# Patient Record
Sex: Female | Born: 1935 | Race: White | Hispanic: No | Smoking: Former smoker
Health system: Southern US, Community
[De-identification: ages and names within clinical notes are randomized; demographics above are authoritative.]

## PROBLEM LIST (undated history)

## (undated) DIAGNOSIS — J449 Chronic obstructive pulmonary disease, unspecified: Secondary | ICD-10-CM

## (undated) DIAGNOSIS — I10 Essential (primary) hypertension: Secondary | ICD-10-CM

## (undated) HISTORY — PX: ABDOMINAL HYSTERECTOMY: SHX81

## (undated) HISTORY — PX: CHOLECYSTECTOMY: SHX55

## (undated) HISTORY — PX: TONSILLECTOMY AND ADENOIDECTOMY: SUR1326

---

## 2004-05-11 ENCOUNTER — Inpatient Hospital Stay: Payer: Self-pay | Admitting: Family Medicine

## 2004-05-11 ENCOUNTER — Other Ambulatory Visit: Payer: Self-pay

## 2004-05-16 ENCOUNTER — Other Ambulatory Visit: Payer: Self-pay

## 2005-08-24 ENCOUNTER — Ambulatory Visit: Payer: Self-pay | Admitting: Internal Medicine

## 2006-03-14 ENCOUNTER — Ambulatory Visit: Payer: Self-pay | Admitting: Gastroenterology

## 2006-08-29 ENCOUNTER — Ambulatory Visit: Payer: Self-pay | Admitting: Internal Medicine

## 2007-01-04 ENCOUNTER — Ambulatory Visit: Payer: Self-pay | Admitting: Rheumatology

## 2007-02-18 ENCOUNTER — Emergency Department: Payer: Self-pay | Admitting: Emergency Medicine

## 2007-02-18 ENCOUNTER — Other Ambulatory Visit: Payer: Self-pay

## 2007-02-19 ENCOUNTER — Emergency Department: Payer: Self-pay | Admitting: Emergency Medicine

## 2007-07-26 ENCOUNTER — Ambulatory Visit: Payer: Self-pay | Admitting: Orthopedic Surgery

## 2007-08-15 ENCOUNTER — Other Ambulatory Visit: Payer: Self-pay

## 2007-08-16 ENCOUNTER — Inpatient Hospital Stay: Payer: Self-pay | Admitting: Internal Medicine

## 2007-08-30 ENCOUNTER — Ambulatory Visit: Payer: Self-pay | Admitting: Internal Medicine

## 2007-09-28 ENCOUNTER — Ambulatory Visit: Payer: Self-pay | Admitting: Internal Medicine

## 2008-03-17 ENCOUNTER — Emergency Department: Payer: Self-pay | Admitting: Emergency Medicine

## 2008-04-10 ENCOUNTER — Ambulatory Visit: Payer: Self-pay | Admitting: Nephrology

## 2009-03-26 ENCOUNTER — Emergency Department: Payer: Self-pay | Admitting: Emergency Medicine

## 2009-05-30 ENCOUNTER — Ambulatory Visit: Payer: Self-pay | Admitting: Internal Medicine

## 2009-07-07 ENCOUNTER — Inpatient Hospital Stay (HOSPITAL_COMMUNITY): Admission: RE | Admit: 2009-07-07 | Discharge: 2009-07-08 | Payer: Self-pay | Admitting: Neurosurgery

## 2009-08-14 ENCOUNTER — Encounter: Admission: RE | Admit: 2009-08-14 | Discharge: 2009-08-14 | Payer: Self-pay | Admitting: Neurosurgery

## 2009-12-11 ENCOUNTER — Ambulatory Visit: Payer: Self-pay | Admitting: Ophthalmology

## 2009-12-24 ENCOUNTER — Ambulatory Visit: Payer: Self-pay | Admitting: Ophthalmology

## 2010-02-02 ENCOUNTER — Ambulatory Visit: Payer: Self-pay | Admitting: Gastroenterology

## 2010-02-17 ENCOUNTER — Ambulatory Visit: Payer: Self-pay | Admitting: Ophthalmology

## 2010-02-25 ENCOUNTER — Ambulatory Visit: Payer: Self-pay | Admitting: Ophthalmology

## 2010-06-13 ENCOUNTER — Emergency Department: Payer: Self-pay | Admitting: Unknown Physician Specialty

## 2010-08-06 LAB — DIFFERENTIAL
Basophils Relative: 1 % (ref 0–1)
Lymphs Abs: 2.9 10*3/uL (ref 0.7–4.0)
Monocytes Relative: 9 % (ref 3–12)
Neutro Abs: 3.6 10*3/uL (ref 1.7–7.7)

## 2010-08-06 LAB — CBC
MCHC: 35.3 g/dL (ref 30.0–36.0)
MCV: 98.3 fL (ref 78.0–100.0)
RDW: 13 % (ref 11.5–15.5)
WBC: 7.8 10*3/uL (ref 4.0–10.5)

## 2010-08-06 LAB — BASIC METABOLIC PANEL
BUN: 16 mg/dL (ref 6–23)
CO2: 29 mEq/L (ref 19–32)
Calcium: 9.9 mg/dL (ref 8.4–10.5)
Chloride: 100 mEq/L (ref 96–112)
Creatinine, Ser: 1.23 mg/dL — ABNORMAL HIGH (ref 0.4–1.2)
Potassium: 4.7 mEq/L (ref 3.5–5.1)
Sodium: 137 mEq/L (ref 135–145)

## 2011-10-16 ENCOUNTER — Ambulatory Visit: Payer: Self-pay | Admitting: Hematology & Oncology

## 2011-10-27 ENCOUNTER — Inpatient Hospital Stay: Payer: Self-pay | Admitting: Internal Medicine

## 2011-10-27 LAB — DIFFERENTIAL
Monocytes: 20 %
Segmented Neutrophils: 43 %

## 2011-10-27 LAB — HEPATIC FUNCTION PANEL A (ARMC)
Alkaline Phosphatase: 54 U/L (ref 50–136)
SGPT (ALT): 33 U/L
Total Protein: 7.1 g/dL (ref 6.4–8.2)

## 2011-10-27 LAB — URINALYSIS, COMPLETE
Hyaline Cast: 27
Nitrite: NEGATIVE
Protein: NEGATIVE

## 2011-10-27 LAB — CBC
HCT: 41.4 % (ref 35.0–47.0)
HGB: 14.1 g/dL (ref 12.0–16.0)
MCH: 33.1 pg (ref 26.0–34.0)
MCHC: 34.1 g/dL (ref 32.0–36.0)
Platelet: 60 10*3/uL — ABNORMAL LOW (ref 150–440)
WBC: 2 10*3/uL — CL (ref 3.6–11.0)

## 2011-10-27 LAB — TROPONIN I
Troponin-I: 0.05 ng/mL
Troponin-I: 0.05 ng/mL

## 2011-10-27 LAB — TSH: Thyroid Stimulating Horm: 1.97 u[IU]/mL

## 2011-10-27 LAB — BASIC METABOLIC PANEL
Calcium, Total: 8.9 mg/dL (ref 8.5–10.1)
Chloride: 91 mmol/L — ABNORMAL LOW (ref 98–107)
Co2: 25 mmol/L (ref 21–32)
Creatinine: 1.31 mg/dL — ABNORMAL HIGH (ref 0.60–1.30)
EGFR (African American): 44 — ABNORMAL LOW
EGFR (Non-African Amer.): 38 — ABNORMAL LOW
Osmolality: 255 (ref 275–301)
Potassium: 3.9 mmol/L (ref 3.5–5.1)
Sodium: 125 mmol/L — ABNORMAL LOW (ref 136–145)

## 2011-10-27 LAB — MAGNESIUM: Magnesium: 1.6 mg/dL — ABNORMAL LOW

## 2011-10-28 LAB — CBC WITH DIFFERENTIAL/PLATELET
Eosinophil #: 0 10*3/uL (ref 0.0–0.7)
Eosinophil %: 1.5 %
HCT: 35 % (ref 35.0–47.0)
Lymphocyte #: 0.9 10*3/uL — ABNORMAL LOW (ref 1.0–3.6)
Lymphocyte %: 45.7 %
Monocyte #: 0.4 x10 3/mm (ref 0.2–0.9)
Neutrophil #: 0.6 10*3/uL — ABNORMAL LOW (ref 1.4–6.5)
RDW: 12.9 % (ref 11.5–14.5)

## 2011-10-28 LAB — MAGNESIUM: Magnesium: 2.2 mg/dL

## 2011-10-28 LAB — BASIC METABOLIC PANEL
BUN: 14 mg/dL (ref 7–18)
Creatinine: 1.11 mg/dL (ref 0.60–1.30)
EGFR (Non-African Amer.): 46 — ABNORMAL LOW
Glucose: 91 mg/dL (ref 65–99)
Sodium: 133 mmol/L — ABNORMAL LOW (ref 136–145)

## 2011-10-29 LAB — BASIC METABOLIC PANEL
BUN: 8 mg/dL (ref 7–18)
Chloride: 103 mmol/L (ref 98–107)
Co2: 24 mmol/L (ref 21–32)
EGFR (Non-African Amer.): >60
Glucose: 99 mg/dL (ref 65–99)
Osmolality: 270 (ref 275–301)
Potassium: 4.3 mmol/L (ref 3.5–5.1)

## 2011-10-29 LAB — CBC WITH DIFFERENTIAL/PLATELET
Basophil #: 0.1 10*3/uL (ref 0.0–0.1)
Eosinophil #: 0.1 10*3/uL (ref 0.0–0.7)
Eosinophil %: 1.7 %
HCT: 37.2 % (ref 35.0–47.0)
HGB: 12.6 g/dL (ref 12.0–16.0)
Lymphocyte %: 43.3 %
Monocyte #: 0.4 x10 3/mm (ref 0.2–0.9)
Neutrophil %: 39.3 %
RDW: 12.8 % (ref 11.5–14.5)

## 2011-10-30 LAB — CBC WITH DIFFERENTIAL/PLATELET
Basophil %: 0.5 %
HCT: 36.9 % (ref 35.0–47.0)
HGB: 12.8 g/dL (ref 12.0–16.0)
Lymphocyte %: 46.9 %
MCH: 33.2 pg (ref 26.0–34.0)
MCV: 95 fL (ref 80–100)
Monocyte #: 0.5 x10 3/mm (ref 0.2–0.9)
Monocyte %: 16.1 %
Neutrophil #: 1.1 10*3/uL — ABNORMAL LOW (ref 1.4–6.5)
RBC: 3.87 10*6/uL (ref 3.80–5.20)

## 2011-10-30 LAB — BASIC METABOLIC PANEL
BUN: 5 mg/dL — ABNORMAL LOW (ref 7–18)
Co2: 27 mmol/L (ref 21–32)
Creatinine: 1.01 mg/dL (ref 0.60–1.30)
EGFR (African American): >60
Sodium: 136 mmol/L (ref 136–145)

## 2011-10-31 LAB — CBC WITH DIFFERENTIAL/PLATELET
Eosinophil %: 2.8 %
HCT: 37.7 % (ref 35.0–47.0)
HGB: 13.1 g/dL (ref 12.0–16.0)
Lymphocyte %: 37.5 %
MCH: 33.2 pg (ref 26.0–34.0)
MCV: 96 fL (ref 80–100)
Monocyte %: 15.1 %
Neutrophil #: 1.5 10*3/uL (ref 1.4–6.5)
Neutrophil %: 44 %
Platelet: 78 10*3/uL — ABNORMAL LOW (ref 150–440)
WBC: 3.5 10*3/uL — ABNORMAL LOW (ref 3.6–11.0)

## 2011-11-01 ENCOUNTER — Ambulatory Visit: Payer: Self-pay | Admitting: Oncology

## 2011-11-01 LAB — CULTURE, BLOOD (SINGLE)

## 2011-11-15 ENCOUNTER — Ambulatory Visit: Payer: Self-pay | Admitting: Hematology & Oncology

## 2011-12-16 ENCOUNTER — Encounter: Payer: Self-pay | Admitting: Internal Medicine

## 2012-11-11 ENCOUNTER — Emergency Department: Payer: Self-pay | Admitting: Emergency Medicine

## 2012-11-11 LAB — CBC
MCV: 96 fL (ref 80–100)
WBC: 8.2 10*3/uL (ref 3.6–11.0)

## 2012-11-11 LAB — URINALYSIS, COMPLETE
Bacteria: NONE SEEN
Bilirubin,UR: NEGATIVE
Ketone: NEGATIVE
Nitrite: NEGATIVE
RBC,UR: <1 /HPF (ref 0–5)
Specific Gravity: 1.004 (ref 1.003–1.030)
Squamous Epithelial: <1
WBC UR: 4 /HPF (ref 0–5)

## 2012-11-11 LAB — COMPREHENSIVE METABOLIC PANEL
Albumin: 4 g/dL (ref 3.4–5.0)
Anion Gap: 6 — ABNORMAL LOW (ref 7–16)
BUN: 13 mg/dL (ref 7–18)
Bilirubin,Total: 0.5 mg/dL (ref 0.2–1.0)
Co2: 26 mmol/L (ref 21–32)
EGFR (African American): >60
Glucose: 131 mg/dL — ABNORMAL HIGH (ref 65–99)
Osmolality: 283 (ref 275–301)
Potassium: 3.8 mmol/L (ref 3.5–5.1)
SGPT (ALT): 37 U/L (ref 12–78)
Total Protein: 7.5 g/dL (ref 6.4–8.2)

## 2012-11-11 LAB — CK TOTAL AND CKMB (NOT AT ARMC)
CK, Total: 150 U/L (ref 21–215)
CK-MB: 2.3 ng/mL (ref 0.5–3.6)

## 2012-11-11 LAB — TROPONIN I: Troponin-I: <0.02 ng/mL

## 2013-01-07 LAB — CBC
MCHC: 36 g/dL (ref 32.0–36.0)
MCV: 94 fL (ref 80–100)
RBC: 4.11 10*6/uL (ref 3.80–5.20)

## 2013-01-07 LAB — COMPREHENSIVE METABOLIC PANEL
Alkaline Phosphatase: 84 U/L (ref 50–136)
Calcium, Total: 9.1 mg/dL (ref 8.5–10.1)
Chloride: 94 mmol/L — ABNORMAL LOW (ref 98–107)
Co2: 25 mmol/L (ref 21–32)
Creatinine: 0.96 mg/dL (ref 0.60–1.30)
EGFR (African American): >60
Glucose: 119 mg/dL — ABNORMAL HIGH (ref 65–99)
Osmolality: 255 (ref 275–301)
SGPT (ALT): 32 U/L (ref 12–78)
Sodium: 127 mmol/L — ABNORMAL LOW (ref 136–145)
Total Protein: 7.2 g/dL (ref 6.4–8.2)

## 2013-01-07 LAB — URINALYSIS, COMPLETE
Blood: NEGATIVE
Ketone: NEGATIVE
Protein: NEGATIVE
Squamous Epithelial: 1
WBC UR: 3 /HPF (ref 0–5)

## 2013-01-07 LAB — TROPONIN I: Troponin-I: <0.02 ng/mL

## 2013-01-08 ENCOUNTER — Inpatient Hospital Stay: Payer: Self-pay | Admitting: Internal Medicine

## 2013-01-09 LAB — COMPREHENSIVE METABOLIC PANEL WITH GFR
Albumin: 3.1 g/dL — ABNORMAL LOW
Alkaline Phosphatase: 73 U/L
Anion Gap: 7
BUN: 10 mg/dL
Bilirubin,Total: 0.6 mg/dL
Calcium, Total: 8.9 mg/dL
Chloride: 105 mmol/L
Co2: 24 mmol/L
Creatinine: 1.05 mg/dL
EGFR (African American): 57 — ABNORMAL LOW
EGFR (Non-African Amer.): 49 — ABNORMAL LOW
Glucose: 99 mg/dL
Osmolality: 271
Potassium: 3.8 mmol/L
SGOT(AST): 25 U/L
SGPT (ALT): 24 U/L
Sodium: 136 mmol/L
Total Protein: 6.5 g/dL

## 2013-01-09 LAB — TSH: Thyroid Stimulating Horm: 3.45 u[IU]/mL

## 2013-01-09 LAB — CBC WITH DIFFERENTIAL/PLATELET
Basophil #: 0 x10 3/mm 3
Basophil %: 0.6 %
Eosinophil #: 0.3 x10 3/mm 3
Eosinophil %: 3.7 %
HCT: 38.4 %
HGB: 13.8 g/dL
Lymphocyte %: 36.5 %
Lymphs Abs: 2.8 x10 3/mm 3
MCH: 33.6 pg
MCHC: 35.9 g/dL
MCV: 94 fL
Monocyte #: 0.7 "x10 3/mm "
Monocyte %: 9.5 %
Neutrophil #: 3.8 x10 3/mm 3
Neutrophil %: 49.7 %
Platelet: 119 x10 3/mm 3 — ABNORMAL LOW
RBC: 4.1 X10 6/mm 3
RDW: 12.7 %
WBC: 7.6 x10 3/mm 3

## 2013-01-09 LAB — URINE CULTURE

## 2013-01-10 LAB — BASIC METABOLIC PANEL
Co2: 24 mmol/L (ref 21–32)
Creatinine: 1.05 mg/dL (ref 0.60–1.30)
EGFR (African American): 57 — ABNORMAL LOW
EGFR (Non-African Amer.): 49 — ABNORMAL LOW
Osmolality: 270 (ref 275–301)
Sodium: 135 mmol/L — ABNORMAL LOW (ref 136–145)

## 2013-01-10 LAB — CBC WITH DIFFERENTIAL/PLATELET
Basophil %: 0.7 %
Eosinophil #: 0.4 10*3/uL (ref 0.0–0.7)
MCHC: 35.9 g/dL (ref 32.0–36.0)
MCV: 93 fL (ref 80–100)
Neutrophil %: 38.9 %
RBC: 4.08 10*6/uL (ref 3.80–5.20)
RDW: 12.8 % (ref 11.5–14.5)

## 2013-02-07 ENCOUNTER — Encounter: Payer: Self-pay | Admitting: Internal Medicine

## 2014-01-04 ENCOUNTER — Observation Stay: Payer: Self-pay | Admitting: Internal Medicine

## 2014-01-04 LAB — URINALYSIS, COMPLETE
BACTERIA: NONE SEEN
BILIRUBIN, UR: NEGATIVE
BLOOD: NEGATIVE
GLUCOSE, UR: NEGATIVE mg/dL (ref 0–75)
Ketone: NEGATIVE
Leukocyte Esterase: NEGATIVE
NITRITE: NEGATIVE
PROTEIN: NEGATIVE
Ph: 7 (ref 4.5–8.0)
RBC,UR: <1 /HPF (ref 0–5)
SPECIFIC GRAVITY: 1.004 (ref 1.003–1.030)
WBC UR: <1 /HPF (ref 0–5)

## 2014-01-04 LAB — TROPONIN I: Troponin-I: <0.02 ng/mL

## 2014-01-04 LAB — CBC
HCT: 44.5 % (ref 35.0–47.0)
HGB: 14.6 g/dL (ref 12.0–16.0)
MCH: 32.8 pg (ref 26.0–34.0)
MCHC: 32.8 g/dL (ref 32.0–36.0)
MCV: 100 fL (ref 80–100)
Platelet: 116 10*3/uL — ABNORMAL LOW (ref 150–440)
RBC: 4.45 10*6/uL (ref 3.80–5.20)
RDW: 12.9 % (ref 11.5–14.5)
WBC: 7.4 10*3/uL (ref 3.6–11.0)

## 2014-01-04 LAB — COMPREHENSIVE METABOLIC PANEL
ALBUMIN: 3.9 g/dL (ref 3.4–5.0)
ALK PHOS: 69 U/L
ANION GAP: 9 (ref 7–16)
AST: 39 U/L — AB (ref 15–37)
BUN: 19 mg/dL — ABNORMAL HIGH (ref 7–18)
Bilirubin,Total: 0.6 mg/dL (ref 0.2–1.0)
CO2: 23 mmol/L (ref 21–32)
Calcium, Total: 9.2 mg/dL (ref 8.5–10.1)
Chloride: 109 mmol/L — ABNORMAL HIGH (ref 98–107)
Creatinine: 1.18 mg/dL (ref 0.60–1.30)
EGFR (African American): 49 — ABNORMAL LOW
GFR CALC NON AF AMER: 42 — AB
Glucose: 90 mg/dL (ref 65–99)
Osmolality: 283 (ref 275–301)
POTASSIUM: 4.4 mmol/L (ref 3.5–5.1)
SGPT (ALT): 26 U/L
Sodium: 141 mmol/L (ref 136–145)
TOTAL PROTEIN: 7.5 g/dL (ref 6.4–8.2)

## 2014-01-04 LAB — PROTIME-INR
INR: 1.1
Prothrombin Time: 14.2 secs (ref 11.5–14.7)

## 2014-01-04 LAB — CK TOTAL AND CKMB (NOT AT ARMC)
CK, Total: 139 U/L
CK-MB: 1.2 ng/mL (ref 0.5–3.6)

## 2014-07-20 ENCOUNTER — Ambulatory Visit: Payer: Self-pay | Admitting: Neurology

## 2014-07-20 ENCOUNTER — Observation Stay: Payer: Self-pay | Admitting: Internal Medicine

## 2014-08-12 ENCOUNTER — Encounter: Payer: Self-pay | Admitting: Internal Medicine

## 2014-09-06 NOTE — H&P (Signed)
PATIENT NAME:  Breanna Ford, Breanna Ford MR#:  [id number] DATE OF BIRTH:  1928/02/05  DATE OF ADMISSION:  01/08/2013  REFERRING PHYSICIAN: Dr. Zenda AlpersWebster   PRIMARY CARE PHYSICIAN:  Dr. Judithann SheenSparks  CHIEF COMPLAINT: Dizziness and weakness.   HISTORY OF PRESENT ILLNESS: This is an 81 year old Caucasian female with past medical history of coronary artery disease, chronic obstructive pulmonary disease and multiple other medical problems, who was just treated last week for  acute sinusitis with some antibiotic for five days. She is feeling very weak and dizzy. Denies any loss of consciousness. Feeling nauseous, but denies any chest pain or shortness of breath. Her sodium is at 127 in the ER, and CAT scan of the head is within normal limits. CAT scan also has revealed air-fluid levels in the left  maxillary sinus. She is also reporting that she has been having diarrhea today. She had five watery bowel movements with no blood in it. Denies any chest pain or shortness of breath. No fevers, no sick contacts. No recent travel. Denies any abdominal pain. Daughter is at bedside.   PAST MEDICAL HISTORY: Coronary artery disease, chronic obstructive pulmonary disease, mitral valve prolapse, thrombocytopenia, hypertension, hyperlipidemia.   PAST SURGICAL HISTORY: Bilateral cataract repair and cholecystectomy.   ALLERGIES:  SULFA.    PSYCHOSOCIAL HISTORY: Lives alone, quite independent and still drives and takes care of herself. Denies smoking, alcohol, or illicit drug usage.    FAMILY HISTORY: The patient does not know her parents  medical history.   HOME MEDICATIONS: Atrovent 2 puff inhalations q. 4 hours as needed, Prevacid 80 mg once daily, hydrochlorothiazide 20 mg once daily, losartan 100 mg once daily, omeprazole 20 mg once daily, lorazepam 0.5 mg as needed for anxiety, levothyroxine 100 mcg once daily, Flonase 50 mcg 1 inhalation once daily, clonidine 0.1 mg every six hours as needed for hypertension, Epogen 10 mg once  daily, amlodipine 5 mg once daily, Advair 100/50, 1 puff inhalation 2 times a day, albuterol as needed.   REVIEW OF SYSTEMS:  CONSTITUTIONAL: Denies any fever, fatigue. Complaining of weakness.  EYES: No blurry vision or glaucoma.  EARS, NOSE, THROAT: No epistaxis or discharge.  RESPIRATORY: Denies cough. Has chronic history of chronic obstructive pulmonary disease.  CARDIOVASCULAR: No chest pain, palpitations. Complaining of dizziness.  GASTROINTESTINAL: Has nausea. Denies vomiting, has diarrhea. Denies abdominal pain.  GENITOURINARY: No dysuria, hematuria.  GYNECOLOGIC AND BREASTS: Denies breast mass or vaginal discharge.  ENDOCRINE: No polyuria, nocturia. Has chronic hypothyroidism.  HEMATOLOGIC AND LYMPHATIC: No anemia, easy bruising.  INTEGUMENTARY: No acne, rash, lesions.  MUSCULOSKELETAL: No joint pain in the neck, back, shoulder, neck. Denies gout.  NEUROLOGIC:  No vertigo or ataxia.  PSYCHIATRIC: No ADD, OCD.   PHYSICAL EXAMINATION: VITAL SIGNS: Temperature 97.7, pulse 65, respirations 16, blood pressure 145/70, pulse oximetry 97%.  GENERAL APPEARANCE: Not under acute distress. Moderately built and nourished.  HEENT: Normocephalic, atraumatic. Pupils are equally reacting to light and accommodation. No scleral icterus. No conjunctival injection. Positive markedly sinus tenderness, more on the left side than on the right. Positive postnasal drip. Moist mucous membranes.  NECK: Supple. No JVD. No thyromegaly. Range of motion is intact.  LUNGS: Clear to auscultation bilaterally. No accessory muscle use and no anterior chest wall tenderness on palpation.  CARDIAC:  S1, S2  normal. Regular rate and rhythm. No murmurs.  GASTROINTESTINAL: Soft. Bowel sounds are positive in all four quadrants. Nontender, nondistended. No hepatosplenomegaly.  NEUROLOGIC: Awake, alert, oriented x 3. Motor and sensory grossly intact.  Reflexes are 2+. Cranial nerves II through XII are intact. No cerebellar  signs.  EXTREMITIES: No edema. No cyanosis. No clubbing.  PSYCHIATRIC: Normal mood and affect.  MUSCULOSKELETAL: No joint effusion, tenderness or erythema.   LABORATORY, DIAGNOSTIC AND RADIOLOGC DATA:  CAT scan of the head: No acute intracranial process. No evidence of CVA, if for continued concern recommended MRI. Air-fluid in the left maxillary sinuses is nonspecific.   A 12-lead EKG: Normal sinus rhythm at 67 beats per minute. PR interval at 200, normal QT, nonspecific ST-T wave changes.   Troponin less than 0.02. LFTs are normal except AST is slightly elevated at 41. CBC normal except for platelet count, which is low at 117. Urinalysis yellow in color, clear in appearance, nitrite negative. Leukocyte esterase 1+, ketones negative, blood. Glucose 119, BUN 10, creatinine 0.96, sodium 127, potassium 3.2, chloride 94, CO2 25. GFR greater than 60. Anion gap 8, serum osmolality 255, calcium 9.1.   ASSESSMENT AND PLAN: An 81 year old Caucasian female brought into the ER for weakness and dizziness associated with diarrhea for one day, will be admitted with the following assessment and plan.  1.  Dizziness, probably from acute sinusitis and diarrhea. CT head is negative. We will admit her to telemetry and monitor closely. We will check orthostatics. We will provide her IV levofloxacin for an acute sinusitis for the five days. We will get culture and sensitivity and occult blood. We will also get stool for Clostridium difficile toxin. The patient will be on enteric precautions.  2.  Acute sinusitis n acute cystitis. Urine culture and sensitivity is  ordered. We will put her on IV levofloxacin.  3.  Diarrhea. The patient will be on enteric precautions. Follow up on the stool test.  4.  Hyponatremia. This could be from hydrochlorothiazide. We will hold off on hydrochlorothiazide and provide her IV fluids.  5.  Chronic history of chronic obstructive pulmonary disease. Resume her home medications including  inhalers,  no exacerbation at this point.  6.  Hypertension. Resume all of blood pressure medications except hydrochlorothiazide.  7.  CODE STATUS:  She is full code. Daughter is medical power of attorney.  8.  We will provide her gastrointestinal and deep vein thrombosis prophylaxis.  9.  Will transfer the patient to Dr. Judithann Sheen in the morning.     Total time spent on admission: 50 minutes. Patient and daughter agree and understand plan of care.    ____________________________ Ramonita Lab, MD ag:cc D: 01/08/2013 02:08:58 ET T: 01/08/2013 02:25:54 ET JOB#: [id number]  cc: Ramonita Lab, MD, <Dictator> Ramonita Lab MD ELECTRONICALLY SIGNED 01/09/2013 7:05

## 2014-09-06 NOTE — Discharge Summary (Signed)
PATIENT NAME:  Breanna Ford, Breanna Ford MR#:  [id number] DATE OF BIRTH:  [birth year]  DATE OF ADMISSION:  01/08/2013 DATE OF DISCHARGE: 01/10/2013   REASON FOR ADMISSION: Dizziness and weakness.   HISTORY OF PRESENT ILLNESS: The patient is an 81 year old female with a history of coronary artery disease, COPD and multiple other medical issues, who presented with sinusitis and near syncope associated with weakness and dizziness. No loss of consciousness. She had had some nausea and diarrhea. She was weak in the Emergency Room and was admitted for further evaluation.   PAST MEDICAL HISTORY:  1.  ASCVD.  2.  COPD.  3.  Benign hypertension.  4.  Hyperlipidemia.  5.  Mitral valve prolapse.  6.  Chronic thrombocytopenia.  7.  Status post cholecystectomy.  8.  Status post cataract repair.   MEDICATIONS ON ADMISSION: please see Admission note  ALLERGIES: SULFA.   SOCIAL HISTORY: Negative for alcohol or tobacco abuse.   FAMILY HISTORY: Noncontributory.   REVIEW OF SYSTEMS: As per admission note.   PHYSICAL EXAM:  GENERAL: The patient was in no acute distress.  VITAL SIGNS: Stable and she was afebrile.  HEENT: Unremarkable.  NECK: Supple without JVD.  LUNGS: Clear.  CARDIOVASCVULAR: Revealed a regular rate and rhythm. Normal S1, S2.  ABDOMEN: Soft and nontender.  EXTREMITIES: Without edema.  NEUROLOGIC: Grossly nonfocal.   HOSPITAL COURSE: The patient was admitted with near syncope associated with acute sinusitis and diarrheal illness. Head CT was negative. C. difficile negative. She was placed on antibiotics for her sinusitis with improvement of her symptoms. She was given IV fluids. She had no further diarrhea. Her urine culture was negative. Her respiratory status and cardiac status remained stable. She was seen by physical therapy, who recommended short-term rehab because of her weakness. The patient and family agreed. She is now transferred to the skilled nursing facility for further care and  rehabilitation.   DISCHARGE DIAGNOSES:  1.  Near syncope.  2.  Acute sinusitis.  3.  Diarrheal illness, resolved.  4.  Chronic thrombocytopenia.  5.  Benign hypertension.  6.  Hyperlipidemia.  7.  Atherosclerotic cardiovascular disease.  8.  Chronic obstructive pulmonary disease.   DISCHARGE MEDICATIONS:  1.  Ventolin 2 puffs q.i.d.  2.  Norvasc 5 mg p.o. daily.  3.  Flonase 2 puffs to each nostril daily.  4.  Advair 100/50, 1 puff b.i.d.  5.  Synthroid 100 mcg p.o. daily.  6.  Ativan 0.5 mg p.o. q.8 hours p.r.n. anxiety.  7.  Cozaar 100 mg p.o. daily.  8.  Zofran 4 mg p.o. q.4 hours p.r.n. nausea and vomiting.  9.  Ibuprofen 400 mg p.o. q.6 hours p.r.n. pain and fever.  10. Dulcolax suppository 1 per rectum b.i.d. p.r.n., constipation.  11. Milk of magnesia 30 mL p.o. q.6 hours p.r.n., constipation.  12. Levaquin 250 mg p.o. daily for 1 week.  13. Ambien 5 mg p.o. at bedtime p.r.n. sleep.   FOLLOWUP PLANS AND APPOINTMENTS: The patient will be followed by the resident physician at the skilled nursing facility. She is on a 2 gram sodium diet. We will obtain a CBC and a MET-B in 1 week.  ____________________________ Leonie Douglas. Doy Hutching, MD jds:aw D: 01/10/2013 08:24:57 ET T: 01/10/2013 08:45:49 ET JOB#: [id number]  cc: Leonie Douglas. Doy Hutching, MD, <Dictator> Gio Janoski Lennice Sites MD ELECTRONICALLY SIGNED 01/10/2013 10:10

## 2014-09-07 NOTE — Discharge Summary (Signed)
PATIENT NAME:  Breanna Ford, Breanna Ford MR#:  [id number] DATE OF BIRTH:  06-07-1927  DATE OF ADMISSION:  01/04/2014 DATE OF DISCHARGE:  01/06/2014  DIAGNOSES AT THE TIME OF DISCHARGE: 1.  Chest pain, myocardial infarction ruled out. Myoview scan negative for ischemia.  2.  Hypertension.  3.  Hypothyroidism.  4.  Chronic obstructive pulmonary disease.  5.  Hyperlipidemia.  6.  Anxiety.   CHIEF COMPLAINT: Chest pain.   HISTORY OF PRESENT ILLNESS: Breanna Ford is an 81 year old female who presented to the ED complaining of chest pain. The patient states that the pain started when she woke up on the morning of admission and subsequently when she went to a restaurant the pain got worse and she became short of breath, diaphoretic and was also nauseous and dizzy. The patient was initially seen in urgent care and subsequently transferred to the Emergency Room. Please see H and P for full details.   HOSPITAL COURSE: The [facility], was ruled out for an MI. She had a Myoview scan that was negative for ischemia and she was continued on her home medications including inhalers, losartan, amlodipine. She was also ambulated, she appeared chest pain-free and was stable at the time of discharge.   Chest x-ray showed evidence of hyperinflation consistent with COPD with no acute cardiopulmonary abnormalities.   The patient was discharged home on the following medications: losartan 100 mg once a day, levothyroxine 100 mcg once a day, Spiriva HandiHaler 1 capsule once a day, Tylenol 1-2 tablets as needed for pain, amlodipine 5 mg once a day, clonidine 0.1 mg twice a day as needed, fluticasone nasal spray 2 sprays once a day, Advair Diskus 1 puff b.i.d., HCTZ 25 mg once a day as needed for swelling, lorazepam 0.5 mg twice a day as needed for anxiety and nervousness, pravastatin 80 mg once a day, Ventolin HFA 2 puffs 4 times a day as needed and aspirin 81 mg a day.   FOLLOWUP: The patient was advised to  follow up with Dr. Judithann SheenSparks in 1-2 weeks' time. She was stable at the time of discharge.   Total time spent discharging the patient: 35 minutes.    ____________________________ Barbette ReichmannVishwanath Milliani Herrada, MD vh:lt D: 01/06/2014 11:13:19 ET T: 01/06/2014 15:17:09 ET JOB#: [id number]  cc: Barbette ReichmannVishwanath Hussein Macdougal, MD, <Dictator> Barbette ReichmannVISHWANATH Horice Carrero MD ELECTRONICALLY SIGNED 01/23/2014 13:46

## 2014-09-07 NOTE — H&P (Signed)
PATIENT NAME:  Breanna Ford, Breanna Ford MR#:  [id number] DATE OF BIRTH:  [birth year]  DATE OF ADMISSION:  01/04/2014  PRIMARY CARE PHYSICIAN: Aram Beecham, MD.  CARDIOLOGIST: Arnoldo Hooker, MD.   CHIEF COMPLAINT: Chest pain.   HISTORY OF PRESENT ILLNESS: This is an 81 year old female who presents to the hospital complaining of chest pain that began earlier today. The patient says she woke up this morning. She just was not feeling very well. She drove herself to go get some lunch. While at the restaurant, her pain got significantly worse. She became short of breath, diaphoretic, nauseated, and dizzy. She then drove herself to an urgent care and then EMS transferred her to the ER. Her chest pain, since then, has improved, but she continues to have persistent nausea and diaphoresis. Hospitalist services were contacted for treatment and evaluation.   REVIEW OF SYSTEMS:  CONSTITUTIONAL: No documented fever. No weight gain, no weight loss.  EYES: No blurry or double vision.  ENT: No tinnitus. No postnasal drip. No redness of the pharynx. RESPIRATORY: No cough, no wheeze, no hemoptysis. Positive dyspnea.  CARDIOVASCULAR: Positive chest pain. No orthopnea. No palpitations or syncope.  GASTROINTESTINAL: Positive nausea. No vomiting. No diarrhea. No abdominal pain. No melena or hematochezia.  GENITOURINARY: No dysuria and hematuria.  ENDOCRINE: No polyuria or nocturia. No heat or cold intolerance. HEMATOLOGY: No anemia, no bruising, no bleeding.  INTEGUMENTARY: No rashes. No lesions.  MUSCULOSKELETAL: No arthritis. No swelling. No gout.  NEUROLOGIC: No numbness or tingling. No ataxia. No seizure-type activity.  PSYCHIATRIC: Positive anxiety, no insomnia. No ADD.   PAST MEDICAL HISTORY: Consistent with hypertension, anxiety, hypothyroidism, chronic obstructive pulmonary disease, and hyperlipidemia.   ALLERGIES: SULFA DRUGS.   SOCIAL HISTORY: Used to be a smoker, quit about 30 or 40+ years ago. No alcohol  abuse. No illicit drug abuse. Lives at home by herself.   FAMILY HISTORY: Mother and father are both deceased. Mother died from complications of myocardial infarction. She cannot recall what her father died from.   CURRENT MEDICATIONS: Tylenol 1 to 2 tabs daily as needed, Advair 100/50 one puff b.i.d., amlodipine 5 mg daily, cephalexin 250 mg t.i.d., clonidine 0.1 mg b.i.d. as needed, diphenhydramine 25 mg at bedtime, Flonase 2 sprays to each nostril daily, hydrochlorothiazide  25 mg daily as needed for swelling, Synthroid 100 mcg daily, lorazepam 0.5 mg b.i.d. as needed, losartan 100 mg daily, Pravachol 80 mg at bedtime, Spiriva 1 puff daily and albuterol inhaler 2 puffs 4 times daily as needed.   PHYSICAL EXAMINATION: Presently is as follows:  VITAL SIGNS:  Temperature 98, pulse 68, respirations 18, blood pressure 168/66, saturations 98% on room air.  GENERAL: She is a pleasant -appearing female in no apparent distress.  HEENT:  Atraumatic, normocephalic. Her extraocular muscles are intact. Pupils equal and reactive to light. Sclerae anicteric. No conjunctival injection. No oropharyngeal erythema.  NECK: Supple. There is no jugular venous distention. No bruits, no lymphadenopathy, no thyromegaly.  HEART: Regular rate and rhythm. No murmurs. No rubs. No clicks.  LUNGS: Clear to auscultation bilaterally. No rales or rhonchi. No wheezes.  ABDOMEN: Soft, flat, nontender, nondistended. Has good bowel sounds. No hepatosplenomegaly appreciated.  EXTREMITIES: No evidence of any cyanosis, clubbing, or peripheral edema. Has +2 pedal and radial pulses bilaterally.  NEUROLOGICAL: The patient is alert, awake, and oriented x 3 with no focal motor or sensory deficits appreciated bilaterally.  SKIN: Moist and warm with no rashes appreciated.  LYMPHATIC: There is no cervical or axillary lymphadenopathy.  LABORATORY DATA: Serum glucose of 90, BUN 19, creatinine 1.18, sodium 141, potassium 4.4, chloride 109,  bicarbonate 23. LFTs are within normal limits. Troponin less than 0.02. White cell count 7.4, hemoglobin 14.6, hematocrit 44.5, platelet count 116,000. INR is 1.1. Urinalysis within normal limits.   The patient did have a chest x-ray done which showed hyperinflation consistent with COPD.  No  acute cardiopulmonary disease.   ASSESSMENT AND PLAN: This is an 81 year old female with a history of hypertension, anxiety, hypothyroidism, chronic obstructive pulmonary disease, and hyperlipidemia who presents to the hospital with chest pain.   1.  Chest pain. The patient's symptoms are quite typical for angina. Previously, she has not had any extensive cardiac work-up. For now, I will observe her on telemetry, follow sterile cardiac markers. Continue her aspirin and her statin and nitroglycerin for now. I will get a cardiology consult. I will also get a Myoview in the morning.   2.  Hypertension. The patient is presently hemodynamically stable. I will continue on Norvasc and losartan.   3.  Hypothyroidism. Continue Synthroid.   4.  Chronic obstructive pulmonary disease. No acute exacerbation. Continue Advair and Spiriva.   5.  Hyperlipidemia. Continue Pravachol.   6.  Anxiety. Continue with her Ativan.   The patient will be transferred over to Dr. Judithann SheenSparks' service.    TIME SPENT: 45 minutes.    ____________________________ Rolly PancakeVivek J. Cherlynn KaiserSainani, MD vjs:TT D: 01/04/2014 17:56:56 ET T: 01/04/2014 18:20:55 ET JOB#: [id number]  cc: Rolly PancakeVivek J. Cherlynn KaiserSainani, MD, <Dictator> Houston SirenVIVEK J Ethyl Vila MD ELECTRONICALLY SIGNED 01/12/2014 11:48

## 2014-09-07 NOTE — Consult Note (Signed)
PATIENT NAME:  Breanna Ford, Breanna Ford MR#:  [id number] DATE OF BIRTH:  November 20, 1927  DATE OF CONSULTATION:  01/05/2014  PRIMARY CARE PHYSICIAN:  Aram BeechamJeffrey Sparks, MD  CARDIOLOGY:  Arnoldo HookerBruce Kowalski, MD    CONSULTING PHYSICIAN:  Marcina MillardAlexander Rafael Quesada, MD  CHIEF COMPLAINT: Chest pain.   HISTORY OF PRESENT ILLNESS: The patient is an 81 year old female referred for evaluation of chest pain. The patient was in her usual state of health until day of admission. She woke up, did not feel well, went to eat lunch.   At the restaurant, she experienced a sharp substernal chest pain with associated shortness of breath, diaphoresis, nausea, and dizziness.  She went to urgent care.   EMS was called and [facility].  EKG was nondiagnostic. The patient has been admitted to telemetry where she has ruled out for myocardial infarction by CPK, isoenzymes and troponin.   PAST MEDICAL HISTORY:  1.  Hypertension.  2.  Anxiety.  3.  Hypothyroidism.  4.  COPD.   5.  Hyperlipidemia.   MEDICATIONS: Amlodipine 5 mg daily, clonidine 0.1 mg b.i.d., hydrochlorothiazide 25 mg daily p.r.n., losartan 100 mg daily, Pravachol 80 mg at bedtime, Advair 100/50 at 1 puff b.i.d., cephalexin 250 mg t.i.d., diphenhydramine 25 mg at bedtime, Flonase 2 sprays each nostril daily, Synthroid 100 mcg daily, lorazepam 0.5 mg b.i.d. p.r.n., Spiriva 1 puff daily, albuterol inhaler 2 puffs q.i.d. p.r.n.   SOCIAL HISTORY: The patient is a widow. She lives by herself.  She quit tobacco abuse approximately 40 years ago.   FAMILY HISTORY: Mother died status post myocardial infarction.   REVIEW OF SYSTEMS:.  CONSTITUTIONAL: No fever or chills.  EYES: No blurry vision. EARS: No hearing loss.  RESPIRATORY: No shortness of breath.  CARDIOVASCULAR: Chest pain as described above.  GASTROINTESTINAL: The patient has some mild nausea without vomiting.  GENITOURINARY: No dysuria or hematuria. ENDOCRINE: No polyuria or polydipsia.  MUSCULOSKELETAL: No  arthralgias or myalgias.  NEUROLOGICAL: No focal muscle weakness or numbness.  PSYCHOLOGICAL: No depression or anxiety.   PHYSICAL EXAMINATION:  VITAL SIGNS: Blood pressure 147/82, pulse 68, respirations 18, temperature 97.7, pulse oximetry 97%.  HEENT: Pupils equal, reactive to light and accommodation.  NECK: Supple without thyromegaly.  LUNGS: Clear. CARDIOVASCULAR:  Normal JVP. Normal PMI.  Regular rate and rhythm. Normal S1, S2. No appreciable gallop, murmur, or rub.  ABDOMEN: Soft and nontender. Pulses were intact bilaterally.  MUSCULOSKELETAL: Normal muscle tone.  NEUROLOGIC: The patient is alert and oriented x 3. Motor and sensory both grossly intact.   IMPRESSION: An 81 year old female who presents with chest pain with typical and atypical features. Has ruled out for myocardial infarction by CPK, isoenzymes and troponin.   RECOMMENDATIONS:  1.  Agree with overall current therapy.  2.  Would defer full dose anticoagulation.  3.  Proceed with Lexiscan sestamibi study.  4.  Further recommendations pending Lexiscan sestamibi study results.      ____________________________ Marcina MillardAlexander Magda Muise, MD ap:DT D: 01/05/2014 10:15:34 ET T: 01/05/2014 10:55:59 ET JOB#: [id number]  cc: Marcina MillardAlexander Kache Mcclurg, MD, <Dictator> Marcina MillardALEXANDER Maley Venezia MD ELECTRONICALLY SIGNED 01/17/2014 16:00

## 2014-09-08 NOTE — H&P (Signed)
PATIENT NAME:  Breanna Ford, [name] MR#:  [id number] DATE OF BIRTH:  1928/03/10  DATE OF ADMISSION:  10/27/2011  PRIMARY CARE PHYSICIAN: Dr. Yates DecampJohn Walker  REFERRING PHYSICIAN: Dr. Enedina FinnerGoli   CHIEF COMPLAINT: Generalized weakness and nausea for one week.   HISTORY OF PRESENT ILLNESS: 81 year old Caucasian female with a history of mitral valve prolapse, coronary artery disease, thrombocytopenia, carpal tunnel syndrome, hypertension, hyperlipidemia, chronic obstructive pulmonary disease presented to the ED with generalized weakness and nausea for one week. Patient has a recent episode of low blood pressure, weakness and inability to eat so her blood pressure medication was on hold. Her daughter mentioned that she had a tick bite two weeks ago but no rash was noted. Patient also had a headache, dizziness, mild shortness of breath but denies any fever, chills. No abdominal pain, vomiting. No dysuria, hematuria. Patient was found to have bradycardia and Dr. Gwen PoundsKowalski recommended admit the patient to the telemetry floor and monitor heart rate for bradycardia or heart block   PAST MEDICAL HISTORY: As mentioned above:  1. Coronary artery disease.  2. Chronic obstructive pulmonary disease. 3. Mitral valve prolapse.  4. Thrombocytopenia.  5. Hypertension.  6. Hyperlipidemia   SOCIAL HISTORY: Patient denies any smoking, alcohol drinking or illicit drugs.   FAMILY HISTORY: Patient does not know her family history.   PAST SURGICAL HISTORY:  1. Bilateral cataract surgery. 2. Cholecystectomy.   ALLERGIES: Sulfa drugs.   MEDICATIONS:  1. Advair 100 mcg/50 mcg 1 puff b.i.d.  2. Aspirin 325 mg p.o. daily.  3. Dymista 137 mcg/50 mcg nasal spray 2 sprays p.r.n.  4. Levothyroxine 88 mcg p.o. daily.  5. Lorazepam 0.5 mg p.o. tablets 1 tablet p.r.n.  6. Zocor 40 mg p.o. at bedtime.  7. Carlos Americanudorza pressair 40 mcg inhalation powder, 1 pack b.i.d. in the morning and in the evening.  8. Ventolin HFA 90 mcg inhalation  aerosol 2 puffs inhalation once a day.  REVIEW OF SYSTEMS: CONSTITUTIONAL: Patient denies any fever or chills but has generalized weakness and poor oral intake. EYES: No double vision, blurred vision but has cataract. ENT: No epistaxis, postnasal drip, dysphagia or slurred speech. RESPIRATORY: No cough, sputum, shortness of breath, or hemoptysis. CARDIOVASCULAR: No chest pain, palpitation, orthopnea, nocturnal dyspnea. No leg edema. GASTROINTESTINAL: Positive for nausea but no vomiting, abdominal pain or diarrhea. No melena or bloody stool. GENITOURINARY: No dysuria, hematuria, or incontinence. ENDOCRINE: No polydipsia, polydipsia, heat or cold intolerance. HEMATOLOGY: No easy bruising or bleeding. NEUROLOGY: No syncope, loss of consciousness or seizure. SKIN: No rash or jaundice.   PHYSICAL EXAMINATION:  VITAL SIGNS: Temperature 96.8, blood pressure 131/59, pulse 54, respirations 15, oxygen saturation 97% on room air.   GENERAL: Patient is alert, awake, oriented in no acute distress.   HEENT: Pupils round, equal, reactive to light and accommodation. Mild dry oral mucosa. Clear oropharynx.   NECK: Supple. No JVD or carotid bruits. No lymphadenopathy. No thyromegaly.   CARDIOVASCULAR: S1, S2 regular rate, rhythm. No murmurs, gallops.   PULMONARY: Bilateral air entry. No wheezing, rales.   ABDOMEN: Soft. No distention or tenderness. No organomegaly. Bowel sounds present.   EXTREMITIES: No edema, clubbing, or cyanosis. No calf tenderness. Strong bilateral pedal pulses.   SKIN: No rash or jaundice.   NEUROLOGIC: Alert and oriented x3. No focal deficits.   MUSCULOSKELETAL: Power 5/5. Sensation intact. Deep tendon reflexes mute.   LABORATORY, DIAGNOSTIC AND RADIOLOGICAL DATA:  Troponin 0.05. D-dimer 0.34. Urinalysis shows WBC 16, RBC 5, nitrite negative.  WBC 2.0, hemoglobin 14.1, platelets 60, glucose 107, BUN 22, creatinine 1.31, sodium 125, potassium 3.9, chloride 91, bicarbonate 25, TSH  1.97, magnesium 1.6. Chest x-ray showed hyperinflation consistent with chronic obstructive pulmonary disease. No acute cardiopulmonary disease. EKG showed sinus bradycardia at 57 beats per minute.    IMPRESSION:  1. Bradycardia with a history of tick bite.  2. Generalized weakness.  3. Acute renal failure with dehydration.  4. Urinary tract infection.  5. Hyponatremia.  6. Hypomagnesemia.  7. Neutropenia.  8. Thrombocytopenia.  9. Tick bite.  10. History of hypertension. 11. Coronary artery disease. 12. Chronic obstructive pulmonary disease. 13. Mitral valve prolapse.   14. Hyperlipidemia.   PLAN OF TREATMENT:  1. The patient will be admitted to telemetry floor. Will continue telemonitor. Will start normal saline and give magnesium supplement and follow up BMP and magnesium level.  2. For urinary tract infection, will give Rocephin  3. For neutropenia will get neutropenia isolation, follow up CBC.  4. Since patient has thrombocytopenia, we will not give heparin. 5. For deep vein thrombosis prophylaxis,  start TED.   6. Continue aspirin, Zocor.   Discussed patient's situation and plan of treatment with the patient and patient's daughter.   TIME SPENT: About 65 minutes.   ____________________________ Shaune Pollack, MD qc:cms D: 10/27/2011 17:48:29 ET T: 10/28/2011 06:51:41 ET JOB#: [id number]  cc: Shaune Pollack, MD, <Dictator> John B. Danne Harbor, MD Shaune Pollack MD ELECTRONICALLY SIGNED 10/29/2011 16:28

## 2014-09-08 NOTE — Discharge Summary (Signed)
PATIENT NAME:  Breanna Ford, Breanna Ford MR#:  [id number] DATE OF BIRTH:  1928/01/11  DATE OF ADMISSION:  10/27/2011 DATE OF DISCHARGE:  10/31/2011  HISTORY OF PRESENT ILLNESS: Ms. Killian is an 81 year old white lady who presented to the Emergency Room with general weakness and inability to ambulate. She had also had nausea without vomiting. She had been running low blood pressures and had been trying to hold her medicines. She gave an incidental history that she had had a tick bite two weeks prior to admission. In the Emergency Room she was found to have marked bradycardia. Dr. Gwen Pounds was consulted and recommended admission.   PAST MEDICAL HISTORY:  1. Coronary artery disease.  2. Chronic obstructive pulmonary disease.  3. Mitral valve prolapse.  4. Hypertension.  5. Hyperlipidemia.  6. Mild thrombocytopenia.   PAST SURGICAL HISTORY:  1. Previous bilateral cataract extractions.  2. Cholecystectomy.   ALLERGIES: The patient was noted to be allergic to sulfa drugs.   MEDICATIONS ON ADMISSION:  1. Advair 100/50, 1 puff b.i.d.  2. Aspirin 325 mg daily.  3. Dymista  2 puffs daily p.r.n.  4. Levoxyl 88 mcg daily.  5. Lorazepam 0.5 mg daily p.r.n.  6. Zocor 40 mg at bedtime.  7. Tudorza 40-mcg inhalation powder, one packet b.i.d.  8. Ventolin 2 puffs q.i.d. p.r.n.  9. Hydrochlorothiazide 25 mg daily.  10. Norvasc 5 mg daily.  11. Enalapril 20 mg b.i.d.  12. Carvedilol 6.25 mg b.i.d.   ADMISSION PHYSICAL EXAMINATION: Temperature 96.8, blood pressure 131/59, pulse 54, respirations 15, and an oxygen saturation of 97%. Admission physical examination as described by the admitting physician was most notable for dry mucous membranes. The remainder of the examination was relatively unremarkable.   LABORATORY DATA: Admission CBC showed a hemoglobin of 14.1 with hematocrit of 41.4. White count was 2000. Platelet count was 60,000. Sedimentation rate was 8. Admission basic metabolic panel showed a random  blood sugar of 107, BUN 22, creatinine 1.31, sodium of 125, potassium of 3.9, chloride of 91, and an estimated GFR of 38. Liver function studies were notable only for an SGOT of 59. TSH was 1.97. Magnesium was 1.6.   EKG shows sinus bradycardia but was otherwise unremarkable. Admission chest x-ray showed hyperinflation consistent with chronic obstructive pulmonary disease but was otherwise unremarkable.   HOSPITAL COURSE: The patient was admitted to the regular medical floor where she was placed on telemetry. Serial cardiac enzymes were obtained, all of which were normal. The patient was started on IV doxycycline. Her rate control medications were held. The patient was eventually seen in consultation by cardiology, infectious disease, and hematology/oncology. The patient's electrolyte imbalances were repleted IV. Her final chemistry panel showed a BUN of 5 with a creatinine 1.01. Electrolytes were normal. Estimated GFR was 52. Her final CBC showed a white count of 3500 and a platelet count of 78,000. The patient's titers for [facility] [place] spotted fever and for Lyme disease were negative. Rickettsia fever titers were pending at the time of discharge. It was the opinion of the oncologist that her neutropenia might be drug-induced and suggested holding her aspirin and enalapril. Infectious disease consultant suggested leaving her on doxycycline at the time of discharge because of the history of tick exposure. Collagen-vascular screen was pending at the time of discharge. At the time of discharge, the patient was ambulating well.   DISCHARGE DIAGNOSES:  1. Acute renal failure secondary to dehydration.  2. Neutropenia and thrombocytopenia, possibly related to infectious disease.  3. Sinus  bradycardia.  4. Hypertension.    DISCHARGE MEDICATIONS:  1. Doxycycline 100 mg b.i.d. for an additional seven days.  2. Hygroton 25 mg daily.  3. Amlodipine 5 mg daily.  4. Levothyroxine 88 mcg daily.  5. Simvastatin  40 mg daily.  6. Advair Diskus 100/50 b.i.d.  7. Tudorza 1 inhalation daily.  8. Lorazepam 0.5 mg daily p.r.n.  9. Ventolin inhaler 2 puffs q.i.d. p.r.n.  10. Dymista two sprays each nostril daily as needed.    The patient is to discontinue her aspirin, enalapril, and carvedilol.   PLAN: The patient being discharged on a low sodium diet with activity as tolerated. She will be followed up in the office in 1 to 2 weeks at which time her blood pressure will be rechecked. We will also repeat her blood counts.     ____________________________ Letta PateJohn B. Danne HarborWalker III, MD [id number] D: 10/31/2011 09:09:12 ET T: 11/01/2011 10:20:29 ET JOB#: [id number]  cc: Jonny RuizJohn B. Danne HarborWalker III, MD, <Dictator> Elmo PuttJOHN B WALKER III MD ELECTRONICALLY SIGNED 11/07/2011 17:32

## 2014-09-08 NOTE — Consult Note (Signed)
History of Present Illness:   Reason for Consult Thrombocytopenia pancytopenia   History of present illness:The patient is an 81 year old Caucasian female with history of mitral prolapse, coronary artery disease, thrombocytopenia, carpal tunnel syndrome, hypertension, hyperlipidemia, chronic obstructive pulmonary disease, who recently presented  with generallised weakness, nausea for one week duration. She presented after an episode of low blood pressure weakness and inability to eat.Her blood pressure medications are on hold.She presented to ED with weakness.As per her daughter ,she had tick bite 2 weeks ago and noticed that the patient also had headache, dizziness, mild shortness of breath; but no rash was noted,The patient had mild shortness of  breath but denies any fever or chills.No abdominal pain, vomiting.The patient was found to have bradycardia and was admited to telemetry floor for monitoring of a heart rate in view of bradycardia of heart block.    Date of Diagnosis 30-May-2011    Previous Investigations Normal CBC in March 2013    Lab Report WBC 7.5 ANC 51% neutrophils hemoglobin 11.8 platelets 115 in March 2013    HPI   The patient presented  with generallised weakness, nausea for one week duration.presented after an episode of low blood pressure weakness and inability to eat.Her blood pressure medications are on hold.She presented to ED with weakness.As per her daughter ,she had tick bite 2 weeks ago and noticed that the patient also had headache dizziness, mild shortness of breath but no rash was noted.The patient also had headache, dizzines.She had mild shortness of  breath but denies any fever or chills.No abdominal pain, vomiting.The patient was found to have bradycardia and was admited to telemetry floor for monitoring of a heart rate in view of bradycardia of heart block.was started on IV antibiotics for possible infection/UTI.She had improvement in her heart rate after medication  dose adjustments.   PFSH:   Family History noncontributory    Social History noncontributory, negative alcohol, negative tobacco, positive alcohol    Additional Past Medical and Surgical History history of mitral prolapse,  history of  coronary artery disease,  history of thrombocytopenia,   history of carpal tunnel syndrome,  history of hypertension,   history of hyperlipidemia,   history of chronic obstructive pulmonary disease  history of Hypothyroidism   Review of Systems:   General weakness  fatigue    Performance Status (ECOG) 2    HEENT no complaints    Lungs no complaints  SOB    Cardiac palpitations    GI nausea    GU no complaints    Musculoskeletal no complaints    Extremities no complaints    Skin no complaints    Neuro headache  dizziness    Endocrine no complaints  cold intolerance    Psych no complaints    Review of Systems   The patient has generallised weakness, nausea for one week duration.had headache dizziness mild shortness of breath but no rash was noted.patient also had headache, dizzineshad mild shortness of  breath but denies any fever or chillsabdominal pain, vomiting.active bleeding other than easy bruisability.hepatosplenomegaly or lymphadenopathy.  NURSING NOTES: ED Vital Sign Flow Sheet:   12-Jun-13 09:44    Temp Temperature: 96.8    Temp Source: oral    Pulse Pulse: 74    Respirations Respirations: 18    SBP SBP: 124    DBP DBP: 65    Pulse Ox % Pulse Ox %: 96    Source: Room Air    Pain Scale (0-10) Pain Scale (0-10): Scale:8  11:00    Pulse Pulse: 54    Respirations Respirations: 15    SBP SBP: 106    DBP DBP: 83    Pulse Ox % Pulse Ox %: 96    Source: Room Air    Pain Scale (0-10) Pain Scale (0-10): Scale:6    12:00    Pulse Pulse: 54    Respirations Respirations: 18    SBP SBP: 144    DBP DBP: 58    Pulse Ox % Pulse Ox %: 97    Source: Room Air    Pain Scale (0-10) Pain Scale  (0-10): Scale:4    13:00    Pulse Pulse: 54    Respirations Respirations: 17    SBP SBP: 144    DBP DBP: 59    Pulse Ox % Pulse Ox %: 97    Source: Room Air    Pain Scale (0-10) Pain Scale (0-10): Scale:4    14:00    Pulse Pulse: 62    Respirations Respirations: 20    SBP SBP: 165    DBP DBP: 80    Pulse Ox % Pulse Ox %: 97    Source: Room Air    Pain Scale (0-10) Pain Scale (0-10): Scale:4    15:00    Pulse Pulse: 56    Respirations Respirations: 18    SBP SBP: 143    DBP DBP: 66    Pulse Ox % Pulse Ox %: 97    Source: Room Air    Pain Scale (0-10) Pain Scale (0-10): Scale:2    17:00    Pulse Pulse: 54    Respirations Respirations: 15    SBP SBP: 131    DBP DBP: 59    Pulse Ox % Pulse Ox %: 97    Source: Room Air    Pain Scale (0-10) Pain Scale (0-10): Scale:6  NURSING NOTES: **Vital Signs.:   12-Jun-13 18:29    Vital Signs Type: Admission    Temperature Temperature (F): 97.8    Celsius: 36.5    Pulse Pulse: 58    Respirations Respirations: 18    Systolic BP Systolic BP: 145    Diastolic BP (mmHg) Diastolic BP (mmHg): 74    Mean BP: 97    Pulse Ox % Pulse Ox %: 97    Pulse Ox Activity Level: At rest    Oxygen Delivery: Room Air/ 21 %    20:17    Vital Signs Type: Routine    Temperature Temperature (F): 97.6    Celsius: 36.4    Temperature Source: Oral    Pulse Pulse: 55    Respirations Respirations: 18    Systolic BP Systolic BP: 108    Diastolic BP (mmHg) Diastolic BP (mmHg): 67    Mean BP: 80    Pulse Ox % Pulse Ox %: 94    Pulse Ox Activity Level: At rest    Oxygen Delivery: Room Air/ 21 %    13-Jun-13 05:10    Vital Signs Type: Routine    Temperature Temperature (F): 98    Celsius: 36.6    Temperature Source: oral    Pulse Pulse: 50    Respirations Respirations: 20    Systolic BP Systolic BP: 100    Diastolic BP (mmHg) Diastolic BP (mmHg): 62    Mean BP: 74    Pulse Ox % Pulse Ox %:  93    Pulse Ox Activity Level: At rest    Oxygen Delivery: Room Air/  21 %    08:19    Vital Signs Type: Routine    Temperature Temperature (F): 97.7    Celsius: 36.5    Temperature Source: oral    Pulse Pulse: 53    Pulse source: per vital sign device    Respirations Respirations: 17    Systolic BP Systolic BP: 114    Diastolic BP (mmHg) Diastolic BP (mmHg): 69    Mean BP: 84    BP Source: vital sign device    Pulse Ox % Pulse Ox %: 95    Pulse Ox Activity Level: At rest    Oxygen Delivery: Room Air/ 21 %    14:18    Vital Signs Type: Routine    Temperature Temperature (F): 97.9    Celsius: 36.6    Temperature Source: Oral    Pulse Pulse: 56    Pulse source: per vital sign device    Respirations Respirations: 18    Systolic BP Systolic BP: 134    Diastolic BP (mmHg) Diastolic BP (mmHg): 64    Mean BP: 87    Pulse Ox % Pulse Ox %: 97    Oxygen Delivery: Room Air/ 21 %    18:54    Vital Signs Type: Routine    Temperature Temperature (F): 97.3    Celsius: 36.2    Temperature Source: Oral    Pulse Pulse: 54    Pulse source: per vital sign device    Respirations Respirations: 18    Systolic BP Systolic BP: 137    Diastolic BP (mmHg) Diastolic BP (mmHg): 60    Mean BP: 85    Pulse Ox % Pulse Ox %: 97    Pulse Ox Activity Level: At rest    Oxygen Delivery: Room Air/ 21 %    20:15    Vital Signs Type: Routine    Temperature Temperature (F): 97.6    Celsius: 36.4    Temperature Source: Oral    Pulse Pulse: 55    Respirations Respirations: 18    Systolic BP Systolic BP: 129    Diastolic BP (mmHg) Diastolic BP (mmHg): 68    Mean BP: 88    Pulse Ox % Pulse Ox %: 95    Pulse Ox Activity Level: At rest    Oxygen Delivery: Room Air/ 21 %    14-Jun-13 04:14    Vital Signs Type: Routine    Temperature Temperature (F): 97.4    Celsius: 36.3    Temperature Source: Oral    Pulse Pulse: 52    Pulse source: per vital  sign device    Respirations Respirations: 18    Systolic BP Systolic BP: 137    Diastolic BP (mmHg) Diastolic BP (mmHg): 69    Mean BP: 91    Pulse Ox % Pulse Ox %: 95    Oxygen Delivery: Room Air/ 21 %    08:00    Vital Signs Type: Routine    Temperature Temperature (F): 97.8    Celsius: 36.5    Temperature Source: tympanic    Pulse Pulse: 58    Pulse source: per vital sign device    Respirations Respirations: 16    Systolic BP Systolic BP: 132    Diastolic BP (mmHg) Diastolic BP (mmHg): 78    Mean BP: 96    BP Source: vital sign device    Pulse Ox % Pulse Ox %: 94    Pulse Ox Activity Level: At rest    Oxygen  Delivery: Room Air/ 21 %    12:20    Vital Signs Type: Routine    Temperature Temperature (F): 97.8    Celsius: 36.5    Temperature Source: Oral    Pulse Pulse: 55    Pulse source: per vital sign device    Respirations Respirations: 18    Systolic BP Systolic BP: 135    Diastolic BP (mmHg) Diastolic BP (mmHg): 70    Mean BP: 91    BP Source: vital sign device    Pulse Ox % Pulse Ox %: 96    Pulse Ox Activity Level: At rest    Oxygen Delivery: Room Air/ 21 %    16:15    Vital Signs Type: Routine    Temperature Temperature (F): 97.9    Celsius: 36.6    Temperature Source: Oral    Pulse Pulse: 56    Pulse source: per vital sign device    Respirations Respirations: 18    Systolic BP Systolic BP: 148    Diastolic BP (mmHg) Diastolic BP (mmHg): 76    BP Source: vital sign device    Pulse Ox % Pulse Ox %: 97    Pulse Ox Activity Level: At rest    Oxygen Delivery: Room Air/ 21 %    20:38    Vital Signs Type: Routine    Temperature Temperature (F): 97.8    Celsius: 36.5    Temperature Source: Oral    Pulse Pulse: 58    Respirations Respirations: 17    Systolic BP Systolic BP: 138    Diastolic BP (mmHg) Diastolic BP (mmHg): 75    Mean BP: 96    Pulse Ox % Pulse Ox %: 97    Pulse Ox Activity Level: At  rest    Oxygen Delivery: Room Air/ 21 %   Physical Exam:   HEENT: normal    Lungs: clear    Cardiac: regular rate, rhythm  bradycardia    Breast: Bilateral normal    Abdomen: soft  nontender  positive bowel sounds  nondistended  No hepatosplenomegaly    Skin: intact  No rash    Musculoskeletal: No joint swellings or deformities    Extremities: No edema, rash or cyanosis    Neuro: AAOx3  cranial nerves intact  No focal deficits    Psych: normal appearance  alert and cooperative  mood calm    Sulfa drugs: Other    doxycycline monohydrate 100 mg oral tablet: 1 tab(s) orally 2 times a day, Active, 20, None   levothyroxine 88 mcg (0.088 mg) oral tablet: 1 tab(s) orally once a day, Active, 30, None   simvastatin 40 mg oral tablet: 1 tab(s) orally once a day (at bedtime), Active, 30, None   Advair Diskus 100 mcg-50 mcg inhalation powder: 1 puff(s) inhaled 2 times a day, Active, 0, None   Tudorza Pressair 400 mcg/inh inhalation powder: 1 puff(s) inhaled 2 times a day (morning & evening), Active, 0, None   lorazepam 0.5 mg oral tablet: 1 tab(s) orally , As Needed, Active, 0, None   Ventolin HFA 90 mcg/inh inhalation aerosol: 2 puff(s) inhaled once a day, Active, 0, None   Dymista 137 mcg-50 mcg/inh nasal spray: 2 spray(s) nasal , As Needed, Active, 0, None   enalapril 20 mg oral tablet: 1 tab(s) orally 2 times a day, Active, 0, None   carvedilol 12.5 mg oral tablet: 0.5 tab(s) orally 2 times a day, Active, 0, None   chlorthalid: 59milligram(s) , 1  tab orally once a day, Active, 0, None   amlodipine 5 mg oral tablet: 1 tab(s) orally once a day, Active, 0, None    Admission/Visit Status,  Unit to place on: 2A - Telemetry  Dr.'s Service: dr. Tilman Neat service ( he saw her already), Hospitalist Admitting: Shaune Pollack, Visit Status: Inpatient ; 427.89 Bradycardia-Condition:Guarded  Date of Admission: 27-Oct-2011, 27-Oct-2011, Active, Standard   Ondansetron injection,  (  Zofran injection )  4 mg, IV push, once  Indication: Nausea/ Vomiting, 27-Oct-2011, Completed, Standard   Doxycycline injection, ( Vibramycin injection )  100 mg in Dextrose 5% 100 ml, IV Piggyback, once, Infuse over 60 minute(s)  Indication: Infection, [Waste Code: SendToRx], -(D5W-157ml over ), 27-Oct-2011, Completed, Standard   Acetaminophen * tablet, ( Tylenol (325 mg) tablet)  650 mg Oral q4h PRN for pain or temp. greater than 100.4  - Indication: Pain/Fever, 27-Oct-2011, Discontinued, Standard   HePARin injection, 5000 unit(s), Subcutaneous, q12h  Indication: Anticoagulant, Monitor Anticoags per hospital protocol, 27-Oct-2011, Discontinued, Standard   MorphINE  injection, 2 to 4 mg, IV push, q4h PRN for pain  Indication: Pain, [Med Admin Window: 30 mins before or after scheduled dose], 27-Oct-2011, Discontinued, Standard   Ondansetron injection, ( Zofran injection )  4 mg, IV push, q4h PRN for Nausea/Vomiting  Indication: Nausea/ Vomiting, 27-Oct-2011, Discontinued, Standard   Pantoprazole tablet, 40 mg Oral q6am  - Indication: Erosive Esophagitis/ GERD  Instructions:  DO NOT CRUSH, 27-Oct-2011, Active, Standard   Aspirin Enteric Coated tablet, ( Ecotrin)  325 mg Oral daily  - Indication: Pain/Fever/Thromboembolic Disorders/Post MI/Prophylaxis MI  Instructions:  Initiate Bleeding Precautions Protocol--DO NOT CRUSH, 27-Oct-2011, Active, Standard   Fluticasone/Salmeterol 100/50 inhaler,  ( Advair Diskus 100/50 inhaler )  1 puff(s) Inhalation bid  Instructions:  RINSE MOUTH AFTER USE, 27-Oct-2011, Active, Standard   Levothyroxine tablet, ( Synthroid)  0.088 mg Oral q6am  - Indication: Thyroid Hormone Replacement, 27-Oct-2011, Active, Standard   Simvastatin tablet, ( Zocor)  40 mg Oral at bedtime  - Indication: Hypercholesterolemia, 27-Oct-2011, Active, Standard   LORazepam tablet, ( Ativan)  0.5 mg Oral q8h PRN for anxiety  - Indication: Anxiety/ Seizure/  Antiemetic Adjunct/ Preop Sedation, 27-Oct-2011, Active, Standard   Magnesium Sulfate injection, 2 gram in Sterile Water 50 ml, IV Piggyback, once, Infuse over 240 minute(s)  Indication: Magnesium Deficiency/ Pre-Eclampsia/ Eclampsia, 27-Oct-2011, Completed, Standard   Doxycycline injection, ( Vibramycin injection )  100 mg in Dextrose 5% 100 ml, IV Piggyback, q12h, Infuse over 60 minute(s)  Indication: Infection, [Waste Code: SendToRx], -(D5W-132ml over ), 27-Oct-2011, Discontinued, Standard   cefTRIAXone injection, ( Rocephin injection )  1 gram, IV Piggyback, q24h, Infuse over 30 minute(s)  Indication: Infection, 28-Oct-2011, Discontinued, Standard   Doxycycline tablet, ( Vibramycin)  100 mg Oral q12h  - Indication: Infection, 29-Oct-2011, Active, Standard   Sodium Chloride 0.9% injection, 2 ml, IV push, q6h, Convert IV to Saline Lock, 29-Oct-2011, Active, Standard   Acetaminophen * tablet,  ( Tylenol (325 mg) tablet)  650 mg Oral q6h PRN for pain  - Indication: Pain/Fever, 29-Oct-2011, Active, Standard   Chest PA and Lateral, Stat-STAT-SOB, 27-Oct-2011, 1 or more Final Results Received, Standard   Physical Therapy Consult, Evaluate and Treat for: Difficulty Walking  FWB (Full Wt Bearing), 29-Oct-2011, Active, Standard  Assessment and Plan:  Impression:   The patient is an 81 year old Caucasian female with history of mitral prolapse, coronary artery disease, thrombocytopenia, carpal tunnel syndrome, hypertension, hyperlipidemia, chronic obstructive pulmonary disease, who recently presented  with generallised weakness,  nausea for one week duration. with pancytopenia with normal CBC counts in March 2013 which is likely due to infectious and inflammatory /drug induced etiology than underlying bone marrow disorder or hematological malignancy. consider following test:etiology:Hepatitis profile,EBV,CMV serology,parvo PCRserology and ehrlichiosis serology in view of recent tick bite  bradycardia and pancytopenia etiology:ANA,anti ds-DNA,RA for possible drug induced lupus (while on enalapril) induced :Most likely drugs could be enalapril,asprin.high dose aspirin and enalapril unless absolutely indicated.Consider dose modifications.antiplatelt,antithrobotic agents.Blood transfusions for symptomatic anemia and platelet transfusions for symtomatic bleeding at platelet count~50000   Plan:   The patient would undergo infectious and inflammatory etiology work up with supportive treatment with careful monitoring until count recovery.continue to follow the patient.  CC Referral:   cc: Dr.Kowalski,Dr.Blocker,Dr.Walker   Electronic Signatures: Rosine DoorNaik, Luretha Eberly G (MD)  (Signed 14-Jun-13 21:15)  Authored: HISTORY OF PRESENT ILLNESS, PFSH, ROS, NURSING NOTES, PE, ALLERGIES, HOME MEDICATIONS, ORDERS, ASSESSMENT AND PLAN, CC Referring Physician   Last Updated: 14-Jun-13 21:15 by Rosine DoorNaik, Kolby Schara G (MD)

## 2014-09-08 NOTE — Consult Note (Signed)
PATIENT NAME:  Breanna Ford, [name] MR#:  [id number] DATE OF BIRTH:  04/19/1928  DATE OF CONSULTATION:  10/27/2011  REFERRING PHYSICIAN:  Dr. Enedina FinnerGoli  CONSULTING PHYSICIAN:  Lamar BlinksBruce J. Lajoy Vanamburg, MD  PRIMARY CARE PHYSICIAN:  Dr. Dan HumphreysWalker  REASON FOR CONSULTATION: Known cardiovascular disease, preventricular contractions, supraventricular tachycardia, valvular heart disease, hypertension, and hyperlipidemia, having hypotension and weakness.   CHIEF COMPLAINT:  "I am weak."   HISTORY OF PRESENT ILLNESS: This is an 81 year old female with known hypertension, hyperlipidemia, supraventricular tachycardia, and valvular heart disease. She has been on multiple medications in the past for these issues which have caused some significant side effects. There has been recent manipulation of these medications. The patient has had recent episodes of low blood pressure, weakness, and inability to eat.  With this inability to eat she appears to be somewhat dehydrated. Therefore, all of the medications were discontinued for further evaluation. The patient has now arrived in the Emergency Room complaining of this weakness. EKG shows sinus bradycardia with nonspecific ST changes. Troponin and CK-MB are within normal limits. There does not appear to be any other significant issues other than a low sodium. The patient has been given some hydration and feels slightly better. There have been no further issues or apparent infection at this time. In the past the patient has had good blood pressure and heart rate control and her valvular heart disease has been minimal and stable. Recent Holter monitor has shown an average heart rate of 56 beats per minute, minimum 46 beats per minute, and a maximum of 74 beats per minute with no evidence of heart block.  REVIEW OF SYSTEMS:  Negative for vision change, ringing in the ears, hearing loss, cough, congestion, heartburn, nausea, vomiting, diarrhea, bloody stools, stomach pain, extremity pain, leg  weakness, cramping of the buttocks, known blood clots, headaches, blackouts, nosebleeds, congestion, trouble swallowing, frequent urination, urination at night, muscle weakness, numbness, anxiety, depression, skin lesions, skin rash.   PAST MEDICAL HISTORY:  1. Mitral valve prolapse.  2. Coronary artery disease.  3. Thrombocytopenia.  4. Carpal tunnel.  5. Hypertension.  6. Hyperlipidemia.   FAMILY HISTORY: No family members with early onset of cardiovascular disease.   SOCIAL HISTORY: Currently denies alcohol or tobacco use.   ALLERGIES: Sulfa.   CURRENT MEDICATIONS: As listed.   PHYSICAL EXAMINATION:  VITAL SIGNS: Blood pressure 94/50 bilaterally, heart rate 60 upright, reclining, and regular.   GENERAL: She is a well appearing female in no acute distress.   HEENT: No icterus, thyromegaly, ulcers, hemorrhage, or xanthelasma.   HEART: Regular rate and rhythm with normal S1 and S2 without significant murmur, gallop, or rub. Point of maximal impulse is normal size and placement. Carotid upstroke normal without bruit. Jugular venous pressure is normal.   LUNGS: Lungs have few basilar crackles with normal respirations.   ABDOMEN: Soft, nontender, without hepatosplenomegaly or masses. Abdominal aorta is normal size without bruit.   EXTREMITIES: 2+ bilateral pulses in dorsal pedal, radial, and femoral arteries without lower extremity edema, cyanosis, clubbing, or ulcers.   NEUROLOGIC: She is oriented to time, place, and person with normal mood and affect.   ASSESSMENT:  81 year old female with palpitations, bradycardia, hyperlipidemia, hypertension, valvular heart disease with weakness and fatigue of unknown etiology, possibly cardiac.    RECOMMENDATIONS:  1. Follow telemetry on telemetry unit and follow for any bradycardia or heart block. 2. Consider event monitor for rhythm disturbances.  3. Possible repeat echocardiogram for LV systolic dysfunction and other valvular heart  disease causing symptoms.  4. Further investigation of infection causing hypotension. 5. Hydration with intravenous fluids. 6. Abstain from all of medications for hypertension. 7. Further treatment options after above.    ____________________________ Lamar Blinks, MD [id number] D: 10/27/2011 13:21:10 ET T: 10/27/2011 15:13:20 ET JOB#: [id number]  cc: Lamar Blinks, MD, <Dictator> Lamar Blinks MD ELECTRONICALLY SIGNED 10/28/2011 7:50

## 2014-09-08 NOTE — Consult Note (Signed)
PATIENT NAME:  Breanna Ford, Breanna Ford MR#:  [id number] DATE OF BIRTH:  [birth year]  DATE OF CONSULTATION:  10/28/2011  REFERRING PHYSICIAN:  Dr. Dan Humphreys   CONSULTING PHYSICIAN:  Rosalyn Gess. Arlethia Basso, MD  REASON FOR CONSULTATION: Bradycardia, neutropenia, and recent tick bite.   HISTORY OF PRESENT ILLNESS: The patient is an 81 year old white female with a past history significant for hypertension, coronary artery disease, and chronic obstructive pulmonary disease who was admitted on 10/27/2010 with several weeks of malaise and weakness. The patient had been feeling generally poorly for the last few weeks. She had been having increased weakness especially with ambulation. On the day prior to admission she had difficulty walking and had to stop and sit down. She has a history of hypertension in the past although there have been times when she has had hypotension. She had been on several different antihypertensive medicines a month or so ago when she developed a syncopal episode. The feeling at that time was that the episode was related to her blood pressure medicines and these were decreased. Despite this, she has continued to have episodic weakness and malaise. She had noted a tick that she removed approximately two weeks ago. Her syncopal episode occurred well before this and she has had no fevers, chills, or sweats. She has had some intermittent headache. She has had no nausea or vomiting. No change in her bowels. No change in her urine. She was admitted to the hospital and placed on telemetry. She was found to be bradycardic. Her blood work demonstrated both neutropenia and thrombocytopenia without evidence for anemia. She states that she had a physical several months earlier and no mention was made of any blood count problems. Similarly, after her syncopal episode no one mentioned to her that her blood counts were off. She was given a dose of doxycycline and is currently off antibiotics.   ALLERGIES: Sulfa drugs.    PAST MEDICAL HISTORY:  1. Hypertension.  2. Coronary artery disease.  3. Chronic obstructive pulmonary disease.  4. Mitral valve prolapse.  5. Hypertension.  6. Hyperlipidemia.  Of note, the past medical history from admission indicates that she has had thrombocytopenia in the past. She was not aware of this.   SOCIAL HISTORY: The patient lives by herself. She does not smoke. She does not drink. She visits with her daughter's family often and they have dogs at home, but she has no pets living with her. She has not traveled outside of [place] any time recently. She has had recent tick exposure on the left thigh.   FAMILY HISTORY: She did not grow up with her immediate family does not know of any medical problems in the family.   REVIEW OF SYSTEMS: GENERAL: No fevers, chills, or sweats. Positive general malaise and weakness. HEENT: Occasional headache. No sinus congestion. Chronic allergic rhinitis symptoms, however. No sore throat. NECK: No stiffness. No swollen glands. RESPIRATORY: No cough. Some shortness of breath with exertion. No sputum production. CARDIAC: No chest pains. No palpitations. No peripheral edema. She has had some peripheral edema in both lower extremities. She has had some left arm discomfort intermittently. RESPIRATORY: No cough.  GI: No nausea, no vomiting, no abdominal pain, no change in her bowels.  GU: No dysuria. No hematuria. No increased frequency. No hesitancy. SKIN: No rashes other than residual red mark at the tick site. She denies having had any rashes over the last several weeks, specifically she has not noted any target-shaped lesions. She has not had any  easy bruising. NEUROLOGIC: She had syncope several weeks ago but no similar episode recently. She has global weakness but no focal weakness. PSYCHIATRIC: No complaints.   PHYSICAL EXAMINATION:  VITAL SIGNS: T-max 98, pulse 53, blood pressure 114/69, 95% on room air.   GENERAL: 81 year old white female in  no acute distress.   HEENT: Normocephalic, atraumatic. Pupils equal, reactive to light. Extraocular motion intact. Sclerae, conjunctivae, and lids are without evidence for emboli or petechiae. Oropharynx shows no erythema or exudate. Gums are in fair condition.   NECK: Supple. Full range of motion. Midline trachea. No lymphadenopathy. No thyromegaly.   LUNGS: Clear to auscultation bilaterally with good air movement. No focal consolidation.   HEART: Bradycardic with regular rhythm. No murmur, rub, or gallop.   ABDOMEN: Soft, nontender, and nondistended. No hepatosplenomegaly. No hernias noted.   EXTREMITIES: No evidence for tenosynovitis. No significant edema.   SKIN: There was a minimally erythematous mark in the left inner thigh consistent with a healing tick bite. There was no other rash appreciated. There were no stigmata of endocarditis, specifically no Janeway lesions or Osler nodes.   NEUROLOGIC: The patient was awake and interactive, moving all four extremities.   PSYCHIATRIC: Mood and affect appeared normal.   LABORATORY DATA: BUN 14, creatinine 1.11, bicarbonate 27, anion gap 7, AST 59, ALT 33, alkaline phosphatase 54, total bilirubin of 0.3. Troponins have been 0.05 times two. White count today is 1.9 with a hemoglobin 12.2, platelet count of 51, ANC 0.6, ALC 0.9. White count on admission was 2 with a platelet count of 60. Blood cultures from admission show no growth. A urinalysis had 3+ leukocyte esterase, negative nitrites, five red cells and 16 white cells per high-power field. EKG showed sinus bradycardia without any evidence for bundle branch block. A chest x-ray showed evidence for chronic obstructive pulmonary disease but no acute infiltrates.   IMPRESSION:  81 year old white female with a history of hypertension, coronary artery disease, and chronic obstructive pulmonary disease who was admitted with malaise, bradycardia, thrombocytopenia, and leukopenia.   RECOMMENDATIONS:   1. It is difficult to link all her symptoms to a single infectious disease. She had a recent tick bite about two weeks ago. Certainly bradycardia could be from a primary cardiac problem, but that would not explain her thrombocytopenia and leukopenia. Of the tickborne diseases, Ehrlichia and RMSF can cause leukopenia and thrombocytopenia, but are almost always associated with fever. Moreover, she has been ill for a few weeks and these are relatively short-lived diseases. Lyme disease can cause conduction problems and this could explain her bradycardia but less likely to have effects on her blood counts. Cardiac involvement with Lyme is a late finding associated with tertiary Lyme disease. The recent tick bite would be less likely to be related to this.  2. Lyme serologies pending. RMSF IgM has been ordered but not IgG. We will add this and Ehrlichia serology.  3. We will start ceftriaxone to empirically treat for possible Lyme disease. If the serology comes back negative we will stop this antibiotic.  4. She has had no symptoms of urinary tract infection and her pyuria is not likely to represent infection.  I would not focus treatment on this.  5. Would continue neutropenic precautions. Hematology consult is pending. Developing leukemia could explain her blood counts.   Thank you very much for involving me in Mrs. Juanetta Gosling' care. This is a moderately complex infectious disease case.    ____________________________ Rosalyn Gess. Rane Dumm, MD meb:bjt D: 10/28/2011 14:18:08  ET T: 10/28/2011 15:36:52 ET JOB#: [id number]  cc: Rosalyn GessMichael E. Katalyn Matin, MD, <Dictator> Rafaela Dinius E Zanna Hawn MD ELECTRONICALLY SIGNED 11/12/2011 15:08

## 2014-09-08 NOTE — Consult Note (Signed)
Impression: 81yo WF w/ h/o HTN, CAD and COPD admitted with malaise, bradycardia, thrombocytopenia and leukopenia.  It is difficult to link all her symptoms to a single infectious disease.  She had a recent tick bite about two weeks ago.  Certainly her bradycardia could be from a primary cardiac problem.  That would not explain her thrombocytopenia and leukopenia.  Of the tick bourne diseases, Ehrlichia and RMSF can cause leukopenia and thrombocytopenia, but are almost always associated with fever.  Moreover, she has been ill for a few weeks and these are relatively short lived diseases. Lyme can cause conduction problems and this could explain her bradycardia, but less likely to have effects on her blood counts.  Cardiac involvement with Lyme disease is a late finding associated with tertiary Lyme.  The recent tick bite would be less likely to be related to this. Lyme serology is pending.  RMSF IgM has been ordered, but not IgG.   Will add this and Ehrlichia serology. Will start ceftriaxone to empircally treat for Lyme disease.  If the serology comes back negative, will stop this. She has had no symptoms of UTI and her pyuria is not likely to represent infection.  Would not focus treatment on this. Continue neutropenic precautions.  Hematology consult pending.  Developing leukemia could explain her blood counts.  Electronic Signatures: Jacen Carlini, Rosalyn GessMichael E (MD)  (Signed on 13-Jun-13 13:59)  Authored  Last Updated: 13-Jun-13 13:59 by Averie Meiner, Rosalyn GessMichael E (MD)

## 2014-09-15 NOTE — H&P (Signed)
PATIENT NAME:  Breanna Ford, QUINTELA MR#:  [id number] DATE OF BIRTH:  Jan 21, 1928  DATE OF ADMISSION:  07/20/2014  PRIMARY CARE PHYSICIAN: Duane Lope. Sparks, MD  HISTORY OF PRESENT ILLNESS: The patient is an 81 year old Caucasian female with past medical history significant for history of hypertension, hyperlipidemia, chronic obstructive pulmonary disease, hypothyroidism, history of anxiety, who presents to the hospital with complaints of dizziness. She has been also weak and having significant headaches. According to the patient, she woke up in the morning and just did not feel well. She was having headaches, frontal and going to be due to into her back. She checked her blood pressure and it was found to be elevated at 160. She took her medications; however, she felt that it even worsened after that. She felt very swimmy-headed and when she checked her blood pressure again, her blood pressure was as high as 190s. She felt weak, nauseated and very short of breath, also was having uncontrollable shaking of her upper body. She decided to come to Emergency Room and the hospitalist services were contacted for admission, since the patient was noted to be very weak and not able to even sit up in the bed. She had her CT scan of head done, which was revealed possible old stroke and her laboratories revealed possible urinary tract infection.   PAST MEDICAL HISTORY: Significant for admission for chest pains. Negative Myoview in August of 2015, hypertension, hypothyroidism, chronic obstructive pulmonary disease, hyperlipidemia, history of anxiety. He also has hypothyroidism.    ALLERGIES: SULFA DRUGS.   SOCIAL HISTORY: She used to be smoker, quit approximately 30 or 40 years ago. No alcohol or illicit drug abuse. Lives at home by herself. Her daughter is close by, who helps her.   FAMILY HISTORY: The patient's mother and father are deceased. The patient's mother died of complications of myocardial infarction. She does not  remember exactly what her father died of.  MEDICATIONS: As follows: Tylenol 325 to 650 mg 1 to 2 tablet once daily as needed, Aclidinium 400 mcg inhalation powder 1 puff twice daily, Advair Diskus 100/50 one puff twice daily, amlodipine 5 mg daily, aspirin 81 mg p.o. daily, buspirone 50 mg p.o. twice daily, clonidine 0.1 mg twice daily as needed, diphenhydramine, which is Benadryl, 25 mg p.o. once at bedtime, fluticasone nasal spray 2 sprays to each nostril once daily as needed, HCTZ 25 mg p.o. daily, levothyroxine 100 mcg p.o. daily, lorazepam 0.5 mg twice daily as needed, Losartan 100 mg p.o. daily, Pravachol 80 mg p.o. daily, tiotropium 1 inhalation once daily, Ventolin 2 puffs every 4 hours as needed.   REVIEW OF SYSTEMS: Positive for shortness of breath, fatigue and weakness since at least yesterday, some double vision, which she has been having for the past 1 year. Admits of sore throat today. Admits of having bilateral cataract removed, shortness of breath, lower extremity edema intermittently, arrhythmias intermittently, nausea intermittently, left upper extremity weakness as well as numbness, frequency of urination, also headaches, frontal radiating to the back and neck, very intense headache, also burning sensation in her legs, which seems to be chronic, has been going on for well.   PHYSICAL EXAMINATION: VITAL SIGNS: On arrival to the hospital, to the Emergency Room, the patient's vital signs: Temperature was 98.1, pulse was 81, respiration was 20, blood pressure 143/75, saturation was 98% on room air.  GENERAL: This is a well-developed, well-nourished Caucasian female in no apparent distress laying on the stretcher. She weak and unable to get up  by herself. She needs some help to get her to sit up.  HEENT: Her pupils are equal, reactive to light, extra ocular movements intact. Has normal hearing. No pharyngeal erythema. Mucosa is moist.  NECK: No masses. Supple, nontender. Thyroid is not  enlarged. No adenopathy. No JVD or carotid bruits. Bilaterally full range of motion.  LUNGS: Clear to auscultation, though markedly diminished breath sounds bilaterally. No rales, rhonchi, or wheezing. No labored inspirations, increased effort, dullness to percussion, not in overt respiratory distress.  CARDIOVASCULAR: S1, S2 appreciated. Rhythm is regular. PMI is not displaced, 1+ pedal pulses and no lower extremity edema, calf tenderness, or cyanosis was noted.  ABDOMEN: Soft, nontender. Bowel sounds are present. No hepatosplenomegaly or masses were noted.  RECTAL: Deferred.  MUSCLE STRENGTH:  Muscle strength 5/5 diffusely. No cyanosis, degenerative joint disease or kyphosis. Gait is not tested.  SKIN: Did not reveal any rashes, lesions, erythema, nodularity, or induration. It was warm and dry to palpation.  LYMPHATIC: No adenopathy in the cervical region.  NEUROLOGIC: Cranial nerves grossly intact. Sensory grosly intact. Motor function grossly intact. No obvious dysarthria or aphasia. The patient is alert, oriented to time, person and place, cooperative, somewhat anxious. Her memory is slightly impaired. No significant confusion, agitation, or depression was noted.   LABORATORY DATA: BMP showed a glucose elevation to 103, BUN of 22 otherwise BMP was unremarkable. The patient's liver enzymes revealed elevation of AST to 39. The patient's troponin was less than 0.02. CBC: White blood cell count 7.7, hemoglobin 14.2, platelet count 137,000. Absolute neutrophil count is 4.5, which is normal. Urinalysis revealed straw clear urine, negative for glucose, bilirubin or ketones. Specific gravity 1.005, pH was 7.0. Negative for blood, protein, nitrites, 2+ leukocyte esterase, 1 red blood cell, 17 white blood cells, trace bacteria, less than 1 epithelial cell.   RADIOLOGIC STUDIES: Chest x-ray, PA and lateral, 07/20/2014 showed no acute cardiopulmonary process. Head CT with no contrast 07/20/2014 showed a small  right subinsular lacunar infarct, age indeterminate, but new as compared to prior exam in 2014. Mild chronic ischemic white matter disease.   ASSESSMENT AND PLAN:  1. Dizziness. Admit patient to medical floor. Get her physical therapy, as well as orthostatic vital signs, and start the patient on IV fluids if needed.  2. Urinary tract infection. The patient will be continued on Rocephin. Urine cultures are taken. We will follow culture results and adjust antibiotics according to results.  3. Stroke. We will initiate the patient with occupational therapy. We will get a brain MRI with neurology consultation. Will get echocardiogram.  A carotid ultrasound was already done in August of 2014, which was normal. We will change her aspirin to Plavix.  4. Generalized weakness. We will continue the patient with physical therapy. The patient may benefit from home health physical therapy.   TIME SPENT: 50 minutes.   ____________________________ Katharina Caperima Tiger Spieker, MD rv:ap D: 07/20/2014 14:01:13 ET T: 07/20/2014 14:30:48 ET JOB#: [id number]  cc: Katharina Caperima Hailey Stormer, MD, <Dictator> Duane LopeJeffrey D. Judithann SheenSparks, MD  Katharina CaperIMA Jocelynn Gioffre MD ELECTRONICALLY SIGNED 08/18/2014 16:01

## 2014-09-15 NOTE — Consult Note (Signed)
PATIENT NAME:  Breanna Ford, Breanna Ford MR#:  [id number] DATE OF BIRTH:  May 23, 1927  DATE OF CONSULTATION:  07/20/2014  CONSULTING PHYSICIAN:  Pauletta BrownsYuriy Perline Awe, MD  REASON FOR CONSULTATION: Dizziness, headaches.   HISTORY OF PRESENT ILLNESS: This is an 81 year old Caucasian female with past medical history of hypertension, hyperlipidemia, chronic obstructive pulmonary disease, hypothyroidism, history of anxiety/depression admits with episodes of dizziness. On admission, the patient was found to have elevated blood pressure of 190 systolic. The patient is also complaining of a headache. Since admission dizziness has improved, the dizziness is described as positional. When the patient changed positions from sitting to lying and from sitting to standing her  dizziness does get worse, but it has improved, as per patient.  Looking in the patient's history, I do not see the patient on antiplatelet therapy, and the patient states she was not on ASA because of her low platelets which are 137 at this point.  PAST MEDICAL HISTORY:  Reviewed, hypertension, hypothyroidism, and chronic obstructive pulmonary disease, hyperlipidemia, and history of anxiety.   ALLERGIES: INCLUDE SULFA DRUGS.   SOCIAL HISTORY: No longer a smoker. No illicit alcohol or drug use.   HOME MEDICATIONS: Have been reviewed.   IMAGING:  The patient has a chronic lacunar infarct which is small, but is likely chronic, at least a few months to probably a year or so old.    REVIEW OF SYSTEMS:  Currently no shortness of breath. No chest pain. No abdominal pain. Positive for dizziness, history of anxiety and depression.   LABORATORY DATA: Work-up is reviewed. Platelet count is 137,000, the patient was found to have a urinary tract infection.   NEUROLOGIC: The patient is alert, awake, oriented to time, place, location, and the reason why she is in the hospital. Facial sensation intact. Facial motor is intact. Tongue is midline. Uvula elevates  symmetrically. Shoulder shrug is intact. Motor appears to be five out of five bilateral upper in the upper and lower extremities. Coordination: Finger-to-nose intact. Sensation intact. Gait not assessed.   IMPRESSION: An 81 year old Caucasian female with past medical history of anxiety, depression, hyperlipidemia, chronic obstructive disease, and hypothyroidism, admitted with positional dizziness. On admission the patient's blood pressure elevated at 190 systolic. Currently dizziness has improved. The patient states she is worse when she stands or is in a sitting position, going from a lying. At this point I am not convinced this is acute ischemia.  I think it is a combination of probably elevated blood pressure, as well as inner ear problem since it is positional. CAT scan of the head reviewed, the patient has a chronic infarct, was not on antiplatelet therapy. I have discussed with patient and the patient's daughter, that I would put her on aspirin 81 mg daily.   PLAN: Physical therapy and occupational therapy tomorrow and aspirin 81 mg daily. Urinary tract infection treatments per primary team, likely meclizine as p.r.n.  As an outpatient physical therapy, occupational therapy clears the patient, I suspect she can be discharged tomorrow. I do not see a need for MRI at this point.   Thank you. It was a pleasure seeing this patient.  This case was discussed with the patient's daughter at bedside.    ____________________________ Pauletta BrownsYuriy Latroya Ng, MD yz:at D: 07/20/2014 19:56:13 ET T: 07/20/2014 20:31:46 ET JOB#: [id number]  cc: Pauletta BrownsYuriy Arihanna Estabrook, MD, <Dictator> Pauletta BrownsYURIY Faiza Bansal MD ELECTRONICALLY SIGNED 08/01/2014 12:11

## 2015-01-01 ENCOUNTER — Emergency Department: Payer: Medicare PPO

## 2015-01-01 ENCOUNTER — Inpatient Hospital Stay
Admit: 2015-01-01 | Discharge: 2015-01-01 | Disposition: A | Discharge status: Home / Self Care | Payer: Medicare PPO | Attending: Cardiovascular Disease | Admitting: Cardiovascular Disease

## 2015-01-01 ENCOUNTER — Inpatient Hospital Stay
Admission: EM | Admit: 2015-01-01 | Discharge: 2015-01-16 | DRG: 871 | Disposition: E | Payer: Medicare PPO | Attending: Internal Medicine | Admitting: Internal Medicine

## 2015-01-01 ENCOUNTER — Encounter: Payer: Self-pay | Admitting: Emergency Medicine

## 2015-01-01 DIAGNOSIS — I248 Other forms of acute ischemic heart disease: Secondary | ICD-10-CM | POA: Diagnosis present

## 2015-01-01 DIAGNOSIS — Z452 Encounter for adjustment and management of vascular access device: Secondary | ICD-10-CM

## 2015-01-01 DIAGNOSIS — A4152 Sepsis due to Pseudomonas: Secondary | ICD-10-CM | POA: Diagnosis present

## 2015-01-01 DIAGNOSIS — G9341 Metabolic encephalopathy: Secondary | ICD-10-CM | POA: Diagnosis not present

## 2015-01-01 DIAGNOSIS — I959 Hypotension, unspecified: Secondary | ICD-10-CM | POA: Diagnosis not present

## 2015-01-01 DIAGNOSIS — E039 Hypothyroidism, unspecified: Secondary | ICD-10-CM | POA: Diagnosis present

## 2015-01-01 DIAGNOSIS — R6521 Severe sepsis with septic shock: Secondary | ICD-10-CM | POA: Diagnosis not present

## 2015-01-01 DIAGNOSIS — J9690 Respiratory failure, unspecified, unspecified whether with hypoxia or hypercapnia: Secondary | ICD-10-CM

## 2015-01-01 DIAGNOSIS — A419 Sepsis, unspecified organism: Secondary | ICD-10-CM

## 2015-01-01 DIAGNOSIS — J441 Chronic obstructive pulmonary disease with (acute) exacerbation: Secondary | ICD-10-CM | POA: Diagnosis present

## 2015-01-01 DIAGNOSIS — E872 Acidosis: Secondary | ICD-10-CM | POA: Diagnosis not present

## 2015-01-01 DIAGNOSIS — I5031 Acute diastolic (congestive) heart failure: Secondary | ICD-10-CM | POA: Diagnosis present

## 2015-01-01 DIAGNOSIS — J9600 Acute respiratory failure, unspecified whether with hypoxia or hypercapnia: Secondary | ICD-10-CM | POA: Diagnosis not present

## 2015-01-01 DIAGNOSIS — Z4659 Encounter for fitting and adjustment of other gastrointestinal appliance and device: Secondary | ICD-10-CM

## 2015-01-01 DIAGNOSIS — I4891 Unspecified atrial fibrillation: Secondary | ICD-10-CM | POA: Diagnosis present

## 2015-01-01 DIAGNOSIS — R6889 Other general symptoms and signs: Secondary | ICD-10-CM

## 2015-01-01 DIAGNOSIS — R6510 Systemic inflammatory response syndrome (SIRS) of non-infectious origin without acute organ dysfunction: Secondary | ICD-10-CM

## 2015-01-01 DIAGNOSIS — Z978 Presence of other specified devices: Secondary | ICD-10-CM

## 2015-01-01 DIAGNOSIS — N183 Chronic kidney disease, stage 3 (moderate): Secondary | ICD-10-CM | POA: Diagnosis present

## 2015-01-01 DIAGNOSIS — J189 Pneumonia, unspecified organism: Secondary | ICD-10-CM | POA: Diagnosis present

## 2015-01-01 DIAGNOSIS — E871 Hypo-osmolality and hyponatremia: Secondary | ICD-10-CM | POA: Diagnosis not present

## 2015-01-01 DIAGNOSIS — Z87891 Personal history of nicotine dependence: Secondary | ICD-10-CM

## 2015-01-01 DIAGNOSIS — I129 Hypertensive chronic kidney disease with stage 1 through stage 4 chronic kidney disease, or unspecified chronic kidney disease: Secondary | ICD-10-CM | POA: Diagnosis present

## 2015-01-01 DIAGNOSIS — N170 Acute kidney failure with tubular necrosis: Secondary | ICD-10-CM | POA: Diagnosis present

## 2015-01-01 HISTORY — DX: Chronic obstructive pulmonary disease, unspecified: J44.9

## 2015-01-01 HISTORY — DX: Essential (primary) hypertension: I10

## 2015-01-01 LAB — CBC WITH DIFFERENTIAL/PLATELET
BAND NEUTROPHILS: 11 % — AB (ref 0–10)
BASOS PCT: 0 % (ref 0–1)
BLASTS: 0 %
Basophils Absolute: 0 10*3/uL (ref 0.0–0.1)
EOS ABS: 0 10*3/uL (ref 0.0–0.7)
Eosinophils Relative: 0 % (ref 0–5)
HEMATOCRIT: 44.1 % (ref 35.0–47.0)
HEMOGLOBIN: 14.8 g/dL (ref 12.0–16.0)
Lymphocytes Relative: 8 % — ABNORMAL LOW (ref 12–46)
Lymphs Abs: 0.7 10*3/uL (ref 0.7–4.0)
MCH: 33.1 pg (ref 26.0–34.0)
MCHC: 33.7 g/dL (ref 32.0–36.0)
MCV: 98.2 fL (ref 80.0–100.0)
MONO ABS: 0.4 10*3/uL (ref 0.1–1.0)
MYELOCYTES: 0 %
Metamyelocytes Relative: 0 %
Monocytes Relative: 5 % (ref 3–12)
NEUTROS PCT: 76 % (ref 43–77)
Neutro Abs: 7.6 10*3/uL (ref 1.7–7.7)
Other: 0 %
PROMYELOCYTES ABS: 0 %
Platelets: 105 10*3/uL — ABNORMAL LOW (ref 150–440)
RBC: 4.49 MIL/uL (ref 3.80–5.20)
RDW: 12.8 % (ref 11.5–14.5)
WBC: 8.7 10*3/uL (ref 3.6–11.0)
nRBC: 0 /100 WBC

## 2015-01-01 LAB — URINALYSIS COMPLETE WITH MICROSCOPIC (ARMC ONLY)
Bilirubin Urine: NEGATIVE
GLUCOSE, UA: NEGATIVE mg/dL
Ketones, ur: NEGATIVE mg/dL
LEUKOCYTES UA: NEGATIVE
Nitrite: POSITIVE — AB
Protein, ur: NEGATIVE mg/dL
Specific Gravity, Urine: 1.015 (ref 1.005–1.030)
pH: 6 (ref 5.0–8.0)

## 2015-01-01 LAB — COMPREHENSIVE METABOLIC PANEL
ALBUMIN: 4 g/dL (ref 3.5–5.0)
ALK PHOS: 47 U/L (ref 38–126)
ALT: 25 U/L (ref 14–54)
AST: 50 U/L — AB (ref 15–41)
Anion gap: 10 (ref 5–15)
BUN: 16 mg/dL (ref 6–20)
CALCIUM: 9.1 mg/dL (ref 8.9–10.3)
CO2: 22 mmol/L (ref 22–32)
CREATININE: 1.44 mg/dL — AB (ref 0.44–1.00)
Chloride: 104 mmol/L (ref 101–111)
GFR calc Af Amer: 37 mL/min — ABNORMAL LOW (ref >60)
GFR calc non Af Amer: 32 mL/min — ABNORMAL LOW (ref >60)
GLUCOSE: 180 mg/dL — AB (ref 65–99)
Potassium: 3.8 mmol/L (ref 3.5–5.1)
SODIUM: 136 mmol/L (ref 135–145)
Total Bilirubin: 1 mg/dL (ref 0.3–1.2)
Total Protein: 6.8 g/dL (ref 6.5–8.1)

## 2015-01-01 LAB — TROPONIN I: Troponin I: 0.03 ng/mL (ref <0.031)

## 2015-01-01 LAB — EXPECTORATED SPUTUM ASSESSMENT W GRAM STAIN, RFLX TO RESP C

## 2015-01-01 LAB — HEMOGLOBIN A1C: Hgb A1c MFr Bld: 5.3 % (ref 4.0–6.0)

## 2015-01-01 LAB — EXPECTORATED SPUTUM ASSESSMENT W REFEX TO RESP CULTURE

## 2015-01-01 LAB — BRAIN NATRIURETIC PEPTIDE: B Natriuretic Peptide: 288 pg/mL — ABNORMAL HIGH (ref 0.0–100.0)

## 2015-01-01 LAB — TSH: TSH: 1.623 u[IU]/mL (ref 0.350–4.500)

## 2015-01-01 MED ORDER — AMLODIPINE BESYLATE 5 MG PO TABS
5.0000 mg | ORAL_TABLET | Freq: Every day | ORAL | Status: DC
  Administered 2015-01-01: 5 mg via ORAL
  Filled 2015-01-01: qty 1

## 2015-01-01 MED ORDER — SODIUM CHLORIDE 0.9 % IV SOLN
INTRAVENOUS | Status: DC
  Administered 2015-01-01 (×2): via INTRAVENOUS

## 2015-01-01 MED ORDER — HYDROCOD POLST-CPM POLST ER 10-8 MG/5ML PO SUER: 5.0000 mL | Freq: Two times a day (BID) | ORAL | Status: DC | PRN

## 2015-01-01 MED ORDER — DOCUSATE SODIUM 100 MG PO CAPS
100.0000 mg | ORAL_CAPSULE | Freq: Two times a day (BID) | ORAL | Status: DC
  Administered 2015-01-02: 100 mg via ORAL
  Filled 2015-01-01 (×2): qty 1

## 2015-01-01 MED ORDER — METHYLPREDNISOLONE SODIUM SUCC 125 MG IJ SOLR
125.0000 mg | Freq: Once | INTRAMUSCULAR | Status: AC
  Administered 2015-01-01: 125 mg via INTRAVENOUS
  Filled 2015-01-01: qty 2

## 2015-01-01 MED ORDER — DEXTROSE 5 % IV SOLN
1.0000 g | INTRAVENOUS | Status: DC
  Administered 2015-01-02: 1 g via INTRAVENOUS
  Filled 2015-01-01 (×2): qty 10

## 2015-01-01 MED ORDER — PRAVASTATIN SODIUM 20 MG PO TABS
80.0000 mg | ORAL_TABLET | Freq: Every day | ORAL | Status: DC
  Administered 2015-01-01 – 2015-01-02 (×2): 80 mg via ORAL
  Filled 2015-01-01 (×2): qty 4

## 2015-01-01 MED ORDER — SODIUM CHLORIDE 0.9 % IJ SOLN
3.0000 mL | Freq: Two times a day (BID) | INTRAMUSCULAR | Status: DC
  Administered 2015-01-01 – 2015-01-02 (×3): 3 mL via INTRAVENOUS

## 2015-01-01 MED ORDER — HYDROCHLOROTHIAZIDE 25 MG PO TABS
25.0000 mg | ORAL_TABLET | Freq: Every day | ORAL | Status: DC
  Administered 2015-01-01 – 2015-01-02 (×2): 25 mg via ORAL
  Filled 2015-01-01 (×2): qty 1

## 2015-01-01 MED ORDER — ALBUTEROL SULFATE (2.5 MG/3ML) 0.083% IN NEBU
INHALATION_SOLUTION | RESPIRATORY_TRACT | Status: AC
  Filled 2015-01-01: qty 3

## 2015-01-01 MED ORDER — IPRATROPIUM-ALBUTEROL 0.5-2.5 (3) MG/3ML IN SOLN
RESPIRATORY_TRACT | Status: AC
  Administered 2015-01-01: 3 mL via RESPIRATORY_TRACT
  Filled 2015-01-01: qty 3

## 2015-01-01 MED ORDER — IPRATROPIUM-ALBUTEROL 0.5-2.5 (3) MG/3ML IN SOLN
3.0000 mL | Freq: Once | RESPIRATORY_TRACT | Status: AC
  Administered 2015-01-01 (×2): 3 mL via RESPIRATORY_TRACT
  Filled 2015-01-01: qty 3

## 2015-01-01 MED ORDER — METHYLPREDNISOLONE SODIUM SUCC 125 MG IJ SOLR
60.0000 mg | Freq: Two times a day (BID) | INTRAMUSCULAR | Status: DC
  Administered 2015-01-01 – 2015-01-02 (×4): 60 mg via INTRAVENOUS
  Filled 2015-01-01 (×4): qty 2

## 2015-01-01 MED ORDER — ACETAMINOPHEN 325 MG PO TABS
650.0000 mg | ORAL_TABLET | Freq: Four times a day (QID) | ORAL | Status: DC | PRN
  Administered 2015-01-01 (×2): 650 mg via ORAL
  Filled 2015-01-01 (×2): qty 2

## 2015-01-01 MED ORDER — ACETAMINOPHEN 650 MG RE SUPP: 650.0000 mg | Freq: Four times a day (QID) | RECTAL | Status: DC | PRN

## 2015-01-01 MED ORDER — METOPROLOL SUCCINATE ER 25 MG PO TB24
25.0000 mg | ORAL_TABLET | Freq: Every day | ORAL | Status: DC
  Administered 2015-01-01: 25 mg via ORAL
  Filled 2015-01-01: qty 1

## 2015-01-01 MED ORDER — MORPHINE SULFATE (PF) 2 MG/ML IV SOLN
1.0000 mg | INTRAVENOUS | Status: DC | PRN
  Administered 2015-01-01 – 2015-01-02 (×4): 1 mg via INTRAVENOUS
  Filled 2015-01-01 (×5): qty 1

## 2015-01-01 MED ORDER — GUAIFENESIN ER 600 MG PO TB12
600.0000 mg | ORAL_TABLET | Freq: Two times a day (BID) | ORAL | Status: DC
  Administered 2015-01-01 – 2015-01-02 (×3): 600 mg via ORAL
  Filled 2015-01-01 (×3): qty 1

## 2015-01-01 MED ORDER — VANCOMYCIN HCL IN DEXTROSE 1-5 GM/200ML-% IV SOLN: 1000.0000 mg | Freq: Once | INTRAVENOUS | Status: DC

## 2015-01-01 MED ORDER — IPRATROPIUM-ALBUTEROL 0.5-2.5 (3) MG/3ML IN SOLN
3.0000 mL | Freq: Four times a day (QID) | RESPIRATORY_TRACT | Status: DC
  Administered 2015-01-01 – 2015-01-02 (×6): 3 mL via RESPIRATORY_TRACT
  Filled 2015-01-01 (×5): qty 3

## 2015-01-01 MED ORDER — ALBUTEROL SULFATE (2.5 MG/3ML) 0.083% IN NEBU: 3.0000 mL | INHALATION_SOLUTION | RESPIRATORY_TRACT | Status: DC | PRN

## 2015-01-01 MED ORDER — VANCOMYCIN HCL IN DEXTROSE 1-5 GM/200ML-% IV SOLN: 1000.0000 mg | INTRAVENOUS | Status: DC

## 2015-01-01 MED ORDER — LOSARTAN POTASSIUM 50 MG PO TABS
100.0000 mg | ORAL_TABLET | Freq: Every day | ORAL | Status: DC
  Administered 2015-01-01: 100 mg via ORAL
  Filled 2015-01-01: qty 2

## 2015-01-01 MED ORDER — MOMETASONE FURO-FORMOTEROL FUM 100-5 MCG/ACT IN AERO
2.0000 | INHALATION_SPRAY | Freq: Two times a day (BID) | RESPIRATORY_TRACT | Status: DC
  Administered 2015-01-01 (×2): 2 via RESPIRATORY_TRACT
  Filled 2015-01-01 (×2): qty 8.8

## 2015-01-01 MED ORDER — DEXTROSE 5 % IV SOLN
1.0000 g | INTRAVENOUS | Status: DC
  Administered 2015-01-01: 1 g via INTRAVENOUS
  Filled 2015-01-01: qty 10

## 2015-01-01 MED ORDER — DEXTROSE 5 % IV SOLN: 500.0000 mg | Freq: Once | INTRAVENOUS | Status: DC

## 2015-01-01 MED ORDER — DIGOXIN 0.25 MG/ML IJ SOLN
0.1250 mg | INTRAMUSCULAR | Status: AC
  Administered 2015-01-01: 0.125 mg via INTRAVENOUS
  Filled 2015-01-01: qty 0.5

## 2015-01-01 MED ORDER — LORAZEPAM 0.5 MG PO TABS
0.5000 mg | ORAL_TABLET | Freq: Two times a day (BID) | ORAL | Status: DC | PRN
  Administered 2015-01-02 (×2): 0.5 mg via ORAL
  Filled 2015-01-01 (×2): qty 1

## 2015-01-01 MED ORDER — FLUTICASONE PROPIONATE 50 MCG/ACT NA SUSP
2.0000 | Freq: Every day | NASAL | Status: DC
  Administered 2015-01-01: 2 via NASAL
  Filled 2015-01-01: qty 16

## 2015-01-01 MED ORDER — DIPHENHYDRAMINE HCL 25 MG PO CAPS
25.0000 mg | ORAL_CAPSULE | Freq: Every evening | ORAL | Status: DC
  Administered 2015-01-01: 25 mg via ORAL
  Filled 2015-01-01: qty 1

## 2015-01-01 MED ORDER — HEPARIN SODIUM (PORCINE) 5000 UNIT/ML IJ SOLN
5000.0000 [IU] | Freq: Three times a day (TID) | INTRAMUSCULAR | Status: DC
  Administered 2015-01-01 – 2015-01-02 (×5): 5000 [IU] via SUBCUTANEOUS
  Filled 2015-01-01 (×4): qty 1

## 2015-01-01 MED ORDER — AZITHROMYCIN 500 MG IV SOLR
500.0000 mg | INTRAVENOUS | Status: DC
Start: 2015-01-01 — End: 2015-01-02
  Administered 2015-01-01 – 2015-01-02 (×2): 500 mg via INTRAVENOUS
  Filled 2015-01-01 (×4): qty 500

## 2015-01-01 MED ORDER — ONDANSETRON HCL 4 MG/2ML IJ SOLN: 4.0000 mg | Freq: Four times a day (QID) | INTRAMUSCULAR | Status: DC | PRN

## 2015-01-01 MED ORDER — AZITHROMYCIN 250 MG PO TABS: 250.0000 mg | ORAL_TABLET | Freq: Every day | ORAL | Status: DC

## 2015-01-01 MED ORDER — BUSPIRONE HCL 5 MG PO TABS
15.0000 mg | ORAL_TABLET | Freq: Two times a day (BID) | ORAL | Status: DC
  Administered 2015-01-01 (×2): 15 mg via ORAL
  Filled 2015-01-01 (×6): qty 1

## 2015-01-01 MED ORDER — LEVOTHYROXINE SODIUM 50 MCG PO TABS
100.0000 ug | ORAL_TABLET | Freq: Every day | ORAL | Status: DC
  Administered 2015-01-01 – 2015-01-02 (×2): 100 ug via ORAL
  Filled 2015-01-01 (×3): qty 2

## 2015-01-01 MED ORDER — ONDANSETRON HCL 4 MG PO TABS: 4.0000 mg | ORAL_TABLET | Freq: Four times a day (QID) | ORAL | Status: DC | PRN

## 2015-01-01 MED ORDER — SODIUM CHLORIDE 0.9 % IV BOLUS (SEPSIS)
1000.0000 mL | Freq: Once | INTRAVENOUS | Status: AC
  Administered 2015-01-01: 1000 mL via INTRAVENOUS

## 2015-01-01 MED ORDER — ONDANSETRON HCL 4 MG/2ML IJ SOLN
4.0000 mg | Freq: Once | INTRAMUSCULAR | Status: AC
  Administered 2015-01-01: 4 mg via INTRAVENOUS
  Filled 2015-01-01: qty 2

## 2015-01-01 NOTE — Care Management (Signed)
Spoke with patient and daughter at the bedside. Patient is oriented but co pain. On 02 which she does not wear at home. DX pneumonia. Daughter stated that she and her sister will be available to help patient at time of discharge and that she would be able to allow her to live with her if need be. Patient is normally independent at home drives self and uses a cane. Has shower chair and elevated commode.No other DME. day 1 of admission anticipate patient may be able to go home to self care but may need Home health. If home health needed pateint and family would like to use [facility] due to insurance benefit.

## 2015-01-01 NOTE — Progress Notes (Signed)
ANTIBIOTIC CONSULT NOTE - INITIAL  Pharmacy Consult for vancomycin Indication: pneumonia  Allergies  Allergen Reactions  . Sulfa Antibiotics Nausea Only    Patient Measurements: Height:  (165.1 cm) Weight: 170 lb (77.111 kg) IBW/kg (Calculated) : 57 Adjusted Body Weight: 65 kg  Vital Signs: Temp: 98.7 F (37.1 C) (08/17 0324) Temp Source: Oral (08/17 0324) BP: 142/102 mmHg (08/17 0500) Pulse Rate: 76 (08/17 0500) Intake/Output from previous day:   Intake/Output from this shift:    Labs:  Recent Labs  12/19/2014 0428  WBC 8.7  HGB 14.8  PLT 105*  CREATININE 1.44*   Estimated Creatinine Clearance: 29.3 mL/min (by C-G formula based on Cr of 1.44). No results for input(s): VANCOTROUGH, VANCOPEAK, VANCORANDOM, GENTTROUGH, GENTPEAK, GENTRANDOM, TOBRATROUGH, TOBRAPEAK, TOBRARND, AMIKACINPEAK, AMIKACINTROU, AMIKACIN in the last 72 hours.   Microbiology: No results found for this or any previous visit (from the past 720 hour(s)).  Medical History: Past Medical History  Diagnosis Date  . COPD (chronic obstructive pulmonary disease)   . Hypertension     Medications:  Infusions:  . azithromycin (ZITHROMAX) 500 MG IVPB    . cefTRIAXone (ROCEPHIN)  IV    . vancomycin     Followed by  . vancomycin     Assessment: 50 yof with consult for vancomycin/PNA. On CTX as well. CrCl 29 mL/min, Vd 54 L, Ke 0.028 hr-1, T1/2 24.1 hr  Goal of Therapy:  Vancomycin trough level 15-20 mcg/ml  Plan:  Expected duration 7 days with resolution of temperature and/or normalization of WBC  Vancomycin 1 gm IV Q24H with stacked dosing protocol. Will check trough before 4th dose.   Carola Frost, Pharm.D. Clinical Pharmacist 12/25/2014,5:48 AM

## 2015-01-01 NOTE — ED Provider Notes (Signed)
[facility] Emergency Department Provider Note  ____________________________________________  Time seen: Approximately 4:27 AM  I have reviewed the triage vital signs and the nursing notes.   HISTORY  Chief Complaint Shortness of Breath    HPI Breanna Ford is a 81 y.o. female who presents to the ED via EMS from home with a chief complaint of shortness of breath and chest pain. Patient reports a 2 to three-day history of productive cough. Nausea all day, vomited once. She awoke this morning shivering with chills and left sided chest pain, generalized weakness, worsening cough, shortness of breath and nausea.Also had some loose bowel movements over the past 2 days. Denies abdominal pain, dysuria, headache, numbness, tingling. Does not wear oxygen at home.   Past Medical History  Diagnosis Date  . COPD (chronic obstructive pulmonary disease)   . Hypertension     There are no active problems to display for this patient.   History reviewed. No pertinent past surgical history.  Current Outpatient Rx  Name  Route  Sig  Dispense  Refill  . ADVAIR DISKUS 250-50 MCG/DOSE AEPB   Inhalation   Inhale 1 puff into the lungs 2 (two) times daily.           Dispense as written.   Marland Kitchen albuterol (PROVENTIL HFA;VENTOLIN HFA) 108 (90 BASE) MCG/ACT inhaler   Inhalation   Inhale 2 puffs into the lungs every 4 (four) hours as needed for wheezing or shortness of breath.         Marland Kitchen amLODipine (NORVASC) 5 MG tablet   Oral   Take 1 tablet by mouth daily.         . busPIRone (BUSPAR) 15 MG tablet   Oral   Take 15 mg by mouth 2 (two) times daily.         . diphenhydrAMINE (BENADRYL) 25 mg capsule   Oral   Take 25 mg by mouth every evening.         . fluticasone (FLONASE) 50 MCG/ACT nasal spray   Each Nare   Place 2 sprays into both nostrils daily.         . hydrochlorothiazide (HYDRODIURIL) 25 MG tablet   Oral   Take 25 mg by mouth daily.         Marland Kitchen  levothyroxine (SYNTHROID, LEVOTHROID) 100 MCG tablet   Oral   Take 1 tablet by mouth daily.         Marland Kitchen LORazepam (ATIVAN) 0.5 MG tablet   Oral   Take 1 tablet by mouth 2 (two) times daily as needed for anxiety.          Marland Kitchen losartan (COZAAR) 100 MG tablet   Oral   Take 100 mg by mouth daily.         . metoprolol succinate (TOPROL-XL) 25 MG 24 hr tablet   Oral   Take 1 tablet by mouth daily.         . pravastatin (PRAVACHOL) 80 MG tablet   Oral   Take 1 tablet by mouth daily.           Allergies Sulfa antibiotics  History reviewed. No pertinent family history.  Social History Social History  Substance Use Topics  . Smoking status: Former Games developer  . Smokeless tobacco: None  . Alcohol Use: No    Review of Systems Constitutional: Positive for chills. Eyes: No visual changes. ENT: No sore throat. Cardiovascular: Positive for chest pain. Respiratory: Positive for productive cough and shortness of  breath. Gastrointestinal: No abdominal pain.  Positive for nausea, no vomiting.  No diarrhea.  No constipation. Genitourinary: Negative for dysuria. Musculoskeletal: Negative for back pain. Skin: Negative for rash. Neurological: Negative for headaches, focal weakness or numbness.  10-point ROS otherwise negative.  ____________________________________________   PHYSICAL EXAM:  VITAL SIGNS: ED Triage Vitals  Enc Vitals Group     BP 12/27/2014 0324 139/67 mmHg     Pulse --      Resp 12/27/2014 0324 24     Temp 01/02/2015 0324 98.7 F (37.1 C)     Temp Source 12/21/2014 0324 Oral     SpO2 01/08/2015 0324 97 %     Weight 01/12/2015 0324 170 lb (77.111 kg)     Height 01/06/2015 0324 5\' 5"  (1.651 m)     Head Cir --      Peak Flow --      Pain Score 01/10/2015 0327 7     Pain Loc --      Pain Edu? --      Excl. in GC? --     Constitutional: Alert and oriented. Ill-appearing and in mild acute distress. Eyes: Conjunctivae are normal. PERRL. EOMI. Head: Atraumatic. Nose: No  congestion/rhinnorhea. Mouth/Throat: Mucous membranes are moist.  Oropharynx non-erythematous. Neck: No stridor.   Cardiovascular: Normal rate, regular rhythm. Grossly normal heart sounds.  Good peripheral circulation. Respiratory: Mildly increased respiratory effort.  No retractions. Lungs with diffuse rhonchi, rales and wheezing. Gastrointestinal: Soft and nontender. No distention. No abdominal bruits. No CVA tenderness. Musculoskeletal: No lower extremity tenderness nor edema.  No joint effusions. Neurologic:  Normal speech and language. No gross focal neurologic deficits are appreciated.  Skin:  Skin is warm, pale, dry and intact. No rash noted. Psychiatric: Mood and affect are normal. Speech and behavior are normal.  ____________________________________________   LABS (all labs ordered are listed, but only abnormal results are displayed)  Labs Reviewed  CBC WITH DIFFERENTIAL/PLATELET - Abnormal; Notable for the following:    Platelets 105 (*)    All other components within normal limits  COMPREHENSIVE METABOLIC PANEL - Abnormal; Notable for the following:    Glucose, Bld 180 (*)    Creatinine, Ser 1.44 (*)    AST 50 (*)    GFR calc non Af Amer 32 (*)    GFR calc Af Amer 37 (*)    All other components within normal limits  CULTURE, BLOOD (ROUTINE X 2)  CULTURE, BLOOD (ROUTINE X 2)  TROPONIN I  BRAIN NATRIURETIC PEPTIDE   ____________________________________________  EKG  ED ECG REPORT I, Georjean Toya J, the attending physician, personally viewed and interpreted this ECG.   Date: 12/22/2014  EKG Time: 0333  Rate: 82  Rhythm: normal EKG, normal sinus rhythm  Axis: LAD  Intervals:none  ST&T Change: Nonspecific  ____________________________________________  RADIOLOGY  Chest x-ray (viewed by me, interpreted per Dr. Phill Myron): 1. Findings concerning for acute left upper lobe pneumonia. 2.  COPD. ____________________________________________   PROCEDURES  Procedure(s) performed: None  Critical Care performed: Yes, see critical care note(s)   CRITICAL CARE Performed by: Irean Hong   Total critical care time: 30 minutes  Critical care time was exclusive of separately billable procedures and treating other patients.  Critical care was necessary to treat or prevent imminent or life-threatening deterioration.  Critical care was time spent personally by me on the following activities: development of treatment plan with patient and/or surrogate as well as nursing, discussions with consultants, evaluation of patient's response to treatment, examination  of patient, obtaining history from patient or surrogate, ordering and performing treatments and interventions, ordering and review of laboratory studies, ordering and review of radiographic studies, pulse oximetry and re-evaluation of patient's condition.  ____________________________________________   INITIAL IMPRESSION / ASSESSMENT AND PLAN / ED COURSE  Pertinent labs & imaging results that were available during my care of the patient were reviewed by me and considered in my medical decision making (see chart for details).  81 year old female with a history of COPD who presents with cough productive of rusty sputum, progressive shortness of breath, chills and rigors. Symptoms concerning for pneumonia, SIRS. Will obtain blood cultures, screening lab work, chest x-ray. Will initiate treatment with DuoNeb, IV Solu-Medrol and antiemetic.  ----------------------------------------- 5:30 AM on 12/16/2014 -----------------------------------------  IV antibiotics ordered for community-acquired pneumonia. Discussed case with hospitalist who will evaluate patient in the emergency department for admission. ____________________________________________   FINAL CLINICAL IMPRESSION(S) / ED DIAGNOSES  Final diagnoses:  CAP (community  acquired pneumonia)  Rigors  SIRS (systemic inflammatory response syndrome)      Irean Hong, MD 12/30/2014 [phone]

## 2015-01-01 NOTE — H&P (Signed)
Breanna Ford is an 81 y.o. female.   Chief Complaint: Extreme fatigue HPI: The patient presents emergency department complaining of extreme fatigue and cough. Despite tiring very easily the patient has been dissipating in her usual daily activities which include water aerobics in the morning. However, she admits to having a cough for the last few days as well as nausea, vomiting and diarrhea. Her left lower chest hurts under her ribs when she coughs. In retrospect the patient admits that she has been fatigued for at least one week since returning from vacation [place] the beach. In the emergency department the patient was found to have a mild oxygen requirement and left upper lobe pneumonia on chest x-ray which prompted the emergency department staff to call for admission.  Past Medical History  Diagnosis Date  . COPD (chronic obstructive pulmonary disease)   . Hypertension     Past Surgical History  Procedure Laterality Date  . Tonsillectomy and adenoidectomy    . Cholecystectomy    . Abdominal hysterectomy      History reviewed. No pertinent family history. patient is unaware of any recurrent medical illnesses throughout the family Social History:  reports that she has quit smoking. She does not have any smokeless tobacco history on file. She reports that she does not drink alcohol. Her drug history is not on file.  Allergies:  Allergies  Allergen Reactions  . Sulfa Antibiotics Nausea Only    Prior to Admission medications   Medication Sig Start Date End Date Taking? Authorizing Provider  ADVAIR DISKUS 250-50 MCG/DOSE AEPB Inhale 1 puff into the lungs 2 (two) times daily.   Yes Historical Provider, MD  albuterol (PROVENTIL HFA;VENTOLIN HFA) 108 (90 BASE) MCG/ACT inhaler Inhale 2 puffs into the lungs every 4 (four) hours as needed for wheezing or shortness of breath.   Yes Historical Provider, MD  amLODipine (NORVASC) 5 MG tablet Take 1 tablet by mouth daily.   Yes Historical Provider, MD   busPIRone (BUSPAR) 15 MG tablet Take 15 mg by mouth 2 (two) times daily.   Yes Historical Provider, MD  diphenhydrAMINE (BENADRYL) 25 mg capsule Take 25 mg by mouth every evening.   Yes Historical Provider, MD  fluticasone (FLONASE) 50 MCG/ACT nasal spray Place 2 sprays into both nostrils daily.   Yes Historical Provider, MD  hydrochlorothiazide (HYDRODIURIL) 25 MG tablet Take 25 mg by mouth daily.   Yes Historical Provider, MD  levothyroxine (SYNTHROID, LEVOTHROID) 100 MCG tablet Take 1 tablet by mouth daily.   Yes Historical Provider, MD  LORazepam (ATIVAN) 0.5 MG tablet Take 1 tablet by mouth 2 (two) times daily as needed for anxiety.    Yes Historical Provider, MD  losartan (COZAAR) 100 MG tablet Take 100 mg by mouth daily.   Yes Historical Provider, MD  metoprolol succinate (TOPROL-XL) 25 MG 24 hr tablet Take 1 tablet by mouth daily.   Yes Historical Provider, MD  pravastatin (PRAVACHOL) 80 MG tablet Take 1 tablet by mouth daily.   Yes Historical Provider, MD     Results for orders placed or performed during the hospital encounter of 01/06/2015 (from the past 48 hour(s))  CBC with Differential     Status: Abnormal (Preliminary result)   Collection Time: 12/19/2014  4:28 AM  Result Value Ref Range   WBC 8.7 3.6 - 11.0 K/uL   RBC 4.49 3.80 - 5.20 MIL/uL   Hemoglobin 14.8 12.0 - 16.0 g/dL   HCT 44.1 35.0 - 47.0 %   MCV 98.2  80.0 - 100.0 fL   MCH 33.1 26.0 - 34.0 pg   MCHC 33.7 32.0 - 36.0 g/dL   RDW 12.8 11.5 - 14.5 %   Platelets 105 (L) 150 - 440 K/uL   Neutrophils Relative % PENDING 43 - 77 %   Neutro Abs PENDING 1.7 - 7.7 K/uL   Band Neutrophils PENDING 0 - 10 %   Lymphocytes Relative PENDING 12 - 46 %   Lymphs Abs PENDING 0.7 - 4.0 K/uL   Monocytes Relative PENDING 3 - 12 %   Monocytes Absolute PENDING 0.1 - 1.0 K/uL   Eosinophils Relative PENDING 0 - 5 %   Eosinophils Absolute PENDING 0.0 - 0.7 K/uL   Basophils Relative PENDING 0 - 1 %   Basophils Absolute PENDING 0.0 - 0.1  K/uL   WBC Morphology PENDING    RBC Morphology PENDING    Smear Review PENDING    Other PENDING %   nRBC PENDING 0 /100 WBC   Metamyelocytes Relative PENDING %   Myelocytes PENDING %   Promyelocytes Absolute PENDING %   Blasts PENDING %  Troponin I     Status: None   Collection Time: 01/07/2015  4:28 AM  Result Value Ref Range   Troponin I 0.03 <0.031 ng/mL    Comment:        NO INDICATION OF MYOCARDIAL INJURY.   Comprehensive metabolic panel     Status: Abnormal   Collection Time: 12/31/2014  4:28 AM  Result Value Ref Range   Sodium 136 135 - 145 mmol/L   Potassium 3.8 3.5 - 5.1 mmol/L   Chloride 104 101 - 111 mmol/L   CO2 22 22 - 32 mmol/L   Glucose, Bld 180 (H) 65 - 99 mg/dL   BUN 16 6 - 20 mg/dL   Creatinine, Ser 1.44 (H) 0.44 - 1.00 mg/dL   Calcium 9.1 8.9 - 10.3 mg/dL   Total Protein 6.8 6.5 - 8.1 g/dL   Albumin 4.0 3.5 - 5.0 g/dL   AST 50 (H) 15 - 41 U/L   ALT 25 14 - 54 U/L   Alkaline Phosphatase 47 38 - 126 U/L   Total Bilirubin 1.0 0.3 - 1.2 mg/dL   GFR calc non Af Amer 32 (L) >60 mL/min   GFR calc Af Amer 37 (L) >60 mL/min    Comment: (NOTE) The eGFR has been calculated using the CKD EPI equation. This calculation has not been validated in all clinical situations. eGFR's persistently <60 mL/min signify possible Chronic Kidney Disease.    Anion gap 10 5 - 15  Brain natriuretic peptide     Status: Abnormal   Collection Time: 01/02/2015  4:28 AM  Result Value Ref Range   B Natriuretic Peptide 288.0 (H) 0.0 - 100.0 pg/mL   Dg Chest Port 1 View  01/06/2015   CLINICAL DATA:  Initial evaluation for acute shortness of breath, cough, left-sided chest pain. History of COPD.  EXAM: PORTABLE CHEST - 1 VIEW  COMPARISON:  Prior radiograph from 07/20/2014  FINDINGS: Cardiac and mediastinal silhouettes are stable in size and contour, and remain within normal limits.  Lungs are normally inflated. Changes related COPD noted. There is patchy multi focal infiltrate within the  medial left upper lobe, suspicious for pneumonia in the setting of cough. No other focal infiltrates. No pulmonary edema or pleural effusion. No pneumothorax.  No acute osseous abnormality.  IMPRESSION: 1. Findings concerning for acute left upper lobe pneumonia. 2. COPD.   Electronically Signed  By: Jeannine Boga M.D.   On: 01/08/2015 05:16    Review of Systems  Constitutional: Positive for chills and malaise/fatigue. Negative for fever.  HENT: Negative for sore throat and tinnitus.   Eyes: Negative for blurred vision and redness.  Respiratory: Positive for cough and shortness of breath.   Cardiovascular: Negative for chest pain, palpitations, orthopnea and PND.  Gastrointestinal: Positive for nausea, vomiting and diarrhea. Negative for abdominal pain.  Genitourinary: Negative for dysuria, urgency and frequency.  Musculoskeletal: Negative for myalgias and joint pain.  Skin: Negative for rash.       No lesions  Neurological: Negative for speech change, focal weakness and weakness.  Endo/Heme/Allergies: Does not bruise/bleed easily.       No temperature intolerance  Psychiatric/Behavioral: Negative for depression and suicidal ideas.    Blood pressure 98/50, pulse 80, temperature 98.7 F (37.1 C), temperature source Oral, resp. rate 26, height 5' 5"  (1.651 m), weight 77.111 kg (170 lb), SpO2 100 %. Physical Exam  Nursing note and vitals reviewed. Constitutional: She is oriented to person, place, and time. She appears well-developed and well-nourished.  HENT:  Head: Normocephalic and atraumatic.  Mouth/Throat: No oropharyngeal exudate.  Eyes: Conjunctivae and EOM are normal. Pupils are equal, round, and reactive to light. No scleral icterus.  Neck: Normal range of motion. Neck supple. No JVD present. No tracheal deviation present. No thyromegaly present.  Cardiovascular: Normal rate, regular rhythm and normal heart sounds.  Exam reveals no gallop and no friction rub.   No murmur  heard. Respiratory: Effort normal. Tachypnea noted. She has decreased breath sounds in the right upper field, the left upper field and the left lower field. She has no wheezes. She has no rhonchi.  GI: Soft. Bowel sounds are normal. She exhibits no distension. There is no tenderness.  Genitourinary:  Deferred  Musculoskeletal: Normal range of motion. She exhibits no edema.  Lymphadenopathy:    She has no cervical adenopathy.  Neurological: She is alert and oriented to person, place, and time. No cranial nerve deficit. She exhibits normal muscle tone.  Skin: Skin is warm and dry. No rash noted. No erythema.  Psychiatric: She has a normal mood and affect. Her behavior is normal. Judgment and thought content normal.     Assessment/Plan This is an 81 year old Caucasian female admitted for pneumonia. 1. Pneumonia: Community-acquired. Patient is received a dose of azithromycin as well as ceftriaxone in the emergency department. I added 1 dose of vancomycin to toxic clinical appearance. Pneumonia severity index 75. 2. COPD: Continue inhaled corticosteroids. DuoNeb as needed for cough wheezing or shortness of breath 3. Hypertension: Controlled. Continue Toprol 4. Hypothyroidism: Continue Synthroid 5. DVT prophylaxis: Heparin 6. GI prophylaxis: None The patient is a full code. Time spent on admission orders and patient care approximately 35 minutes  Harrie Foreman 01/02/2015, 6:26 AM

## 2015-01-01 NOTE — Progress Notes (Signed)
ICU Telemetry clerk notified the RN to inform that pt was in accelerated junction then converted to A-fib.  Dr. Judithann Sheen notified. Orders received.  Pt asymptomatic.  Will continue to monitor and notify MD of any changes.

## 2015-01-01 NOTE — Progress Notes (Signed)
PT Attempt Note  Patient Details Name: Breanna Ford MRN: [id number] DOB: [birth year]   Cancelled Treatment:    Reason Eval/Treat Not Completed: Patient declined, no reason specified. Chart reviewed and RN consulted. RN reports that pt was in a-fib earlier today and was given amiodarone. Converted back to NSR. RN requesting to hold PT evaluation until tomorrow.  Pt and family refuse PT evaluation currently due fatigue and pain. Request PT attempt tomorrow. Will attempt PT evaluation on later date and pt is appropriate and willing to participate.  Sharalyn Ink Huprich PT, DPT   Huprich,Jason 01/07/2015, 3:06 PM

## 2015-01-01 NOTE — Progress Notes (Signed)
Breanna Ford is a 81 y.o. female  <principal problem not specified>   SUBJECTIVE:  Pt admitted with left-sided PNA and pleuritic CP. No fever. WBC normal. Mildly hypoxic.  ______________________________________________________________________  ROS: Review of systems is unremarkable for any active cardiac,respiratory, GI, GU, hematologic, neurologic or psychiatric systems, 10 systems reviewed.  @  Past Medical History  Diagnosis Date  . COPD (chronic obstructive pulmonary disease)   . Hypertension     Past Surgical History  Procedure Laterality Date  . Tonsillectomy and adenoidectomy    . Cholecystectomy    . Abdominal hysterectomy      PHYSICAL EXAM:  BP 108/47 mmHg  Pulse 78  Temp(Src) 98.3 F (36.8 C) (Oral)  Resp 26  Ht  (1.651 m)  Wt 77.111 kg (170 lb)  BMI 28.29 kg/m2  SpO2 97%  Wt Readings from Last 3 Encounters:  01-29-15 77.111 kg (170 lb)            Constitutional: NAD Neck: supple, no thyromegaly Respiratory: scattered rhonchi Cardiovascular: RRR, no murmur, no gallop Abdomen: soft, good BS, nontender Extremities: no edema Neuro: alert and oriented, no focal motor or sensory deficits  ASSESSMENT/PLAN:  Labs and imaging studies were reviewed  Will continue IV ABX, O2, and SVN's. Add IV steroids. Consult PT and CM. Repeat labs and CXR in Am.

## 2015-01-01 NOTE — ED Notes (Signed)
Pt. States SOB that started yesterday morning.  Pt. Also states lt. Lower chest pain.

## 2015-01-02 ENCOUNTER — Inpatient Hospital Stay: Payer: Medicare PPO

## 2015-01-02 DIAGNOSIS — J189 Pneumonia, unspecified organism: Secondary | ICD-10-CM

## 2015-01-02 DIAGNOSIS — R6521 Severe sepsis with septic shock: Secondary | ICD-10-CM

## 2015-01-02 DIAGNOSIS — A419 Sepsis, unspecified organism: Secondary | ICD-10-CM

## 2015-01-02 DIAGNOSIS — J9600 Acute respiratory failure, unspecified whether with hypoxia or hypercapnia: Secondary | ICD-10-CM

## 2015-01-02 DIAGNOSIS — J441 Chronic obstructive pulmonary disease with (acute) exacerbation: Secondary | ICD-10-CM

## 2015-01-02 DIAGNOSIS — R6510 Systemic inflammatory response syndrome (SIRS) of non-infectious origin without acute organ dysfunction: Secondary | ICD-10-CM | POA: Insufficient documentation

## 2015-01-02 LAB — CBC WITH DIFFERENTIAL/PLATELET
BAND NEUTROPHILS: 36 % — AB (ref 0–10)
BASOS ABS: 0 10*3/uL (ref 0.0–0.1)
BASOS PCT: 0 % (ref 0–1)
BLASTS: 0 %
EOS ABS: 0 10*3/uL (ref 0.0–0.7)
Eosinophils Relative: 0 % (ref 0–5)
HEMATOCRIT: 47.1 % — AB (ref 35.0–47.0)
HEMOGLOBIN: 15.4 g/dL (ref 12.0–16.0)
LYMPHS PCT: 21 % (ref 12–46)
Lymphs Abs: 1.2 10*3/uL (ref 0.7–4.0)
MCH: 32.9 pg (ref 26.0–34.0)
MCHC: 32.8 g/dL (ref 32.0–36.0)
MCV: 100.4 fL — ABNORMAL HIGH (ref 80.0–100.0)
MONO ABS: 0.8 10*3/uL (ref 0.1–1.0)
MYELOCYTES: 0 %
Metamyelocytes Relative: 0 %
Monocytes Relative: 14 % — ABNORMAL HIGH (ref 3–12)
Neutro Abs: 3.7 10*3/uL (ref 1.7–7.7)
Neutrophils Relative %: 29 % — ABNORMAL LOW (ref 43–77)
OTHER: 0 %
PROMYELOCYTES ABS: 0 %
Platelets: 94 10*3/uL — ABNORMAL LOW (ref 150–440)
RBC: 4.69 MIL/uL (ref 3.80–5.20)
RDW: 13.8 % (ref 11.5–14.5)
WBC: 5.7 10*3/uL (ref 3.6–11.0)
nRBC: 0 /100 WBC

## 2015-01-02 LAB — BLOOD GAS, ARTERIAL
ALLENS TEST (PASS/FAIL): POSITIVE — AB
Acid-base deficit: 11.1 mmol/L — ABNORMAL HIGH (ref 0.0–2.0)
BICARBONATE: 19.2 meq/L — AB (ref 21.0–28.0)
FIO2: 0.9
Mechanical Rate: 24
O2 SAT: 99.5 %
PEEP: 12 cmH2O
PH ART: 7.1 — AB (ref 7.350–7.450)
Patient temperature: 37
RATE: 24 resp/min
VT: 450 mL
pCO2 arterial: 62 mmHg — ABNORMAL HIGH (ref 32.0–48.0)
pO2, Arterial: 213 mmHg — ABNORMAL HIGH (ref 83.0–108.0)

## 2015-01-02 LAB — COMPREHENSIVE METABOLIC PANEL
ALBUMIN: 3.2 g/dL — AB (ref 3.5–5.0)
ALK PHOS: 16 U/L — AB (ref 38–126)
ALT: 18 U/L (ref 14–54)
ANION GAP: 13 (ref 5–15)
AST: 47 U/L — AB (ref 15–41)
BILIRUBIN TOTAL: 0.5 mg/dL (ref 0.3–1.2)
BUN: 23 mg/dL — AB (ref 6–20)
CALCIUM: 8.4 mg/dL — AB (ref 8.9–10.3)
CO2: 18 mmol/L — AB (ref 22–32)
CREATININE: 1.47 mg/dL — AB (ref 0.44–1.00)
Chloride: 102 mmol/L (ref 101–111)
GFR calc Af Amer: 36 mL/min — ABNORMAL LOW (ref >60)
GFR calc non Af Amer: 31 mL/min — ABNORMAL LOW (ref >60)
GLUCOSE: 131 mg/dL — AB (ref 65–99)
Potassium: 4.5 mmol/L (ref 3.5–5.1)
SODIUM: 133 mmol/L — AB (ref 135–145)
TOTAL PROTEIN: 6.1 g/dL — AB (ref 6.5–8.1)

## 2015-01-02 LAB — GLUCOSE, CAPILLARY
Glucose-Capillary: 128 mg/dL — ABNORMAL HIGH (ref 65–99)
Glucose-Capillary: 152 mg/dL — ABNORMAL HIGH (ref 65–99)
Glucose-Capillary: 170 mg/dL — ABNORMAL HIGH (ref 65–99)
Glucose-Capillary: 192 mg/dL — ABNORMAL HIGH (ref 65–99)

## 2015-01-02 LAB — TROPONIN I: TROPONIN I: 0.35 ng/mL — AB (ref <0.031)

## 2015-01-02 LAB — LACTIC ACID, PLASMA: Lactic Acid, Venous: 7.1 mmol/L (ref 0.5–2.0)

## 2015-01-02 LAB — MRSA PCR SCREENING: MRSA BY PCR: NEGATIVE

## 2015-01-02 MED ORDER — DOXYCYCLINE HYCLATE 100 MG IV SOLR
100.0000 mg | Freq: Two times a day (BID) | INTRAVENOUS | Status: DC
  Filled 2015-01-02 (×3): qty 100

## 2015-01-02 MED ORDER — NOREPINEPHRINE BITARTRATE 1 MG/ML IV SOLN
0.0000 ug/min | INTRAVENOUS | Status: DC
  Administered 2015-01-02: 32 ug/min via INTRAVENOUS
  Filled 2015-01-02 (×2): qty 16

## 2015-01-02 MED ORDER — METOPROLOL TARTRATE 25 MG PO TABS
25.0000 mg | ORAL_TABLET | Freq: Four times a day (QID) | ORAL | Status: DC
  Administered 2015-01-02: 25 mg via ORAL
  Filled 2015-01-02: qty 1

## 2015-01-02 MED ORDER — ENOXAPARIN SODIUM 80 MG/0.8ML ~~LOC~~ SOLN
1.0000 mg/kg | Freq: Every morning | SUBCUTANEOUS | Status: DC
  Administered 2015-01-02: 75 mg via SUBCUTANEOUS
  Filled 2015-01-02: qty 0.8

## 2015-01-02 MED ORDER — AMIODARONE HCL IN DEXTROSE 360-4.14 MG/200ML-% IV SOLN: 60.0000 mg/h | INTRAVENOUS | Status: DC

## 2015-01-02 MED ORDER — FENTANYL 2500MCG IN NS 250ML (10MCG/ML) PREMIX INFUSION
10.0000 ug/h | INTRAVENOUS | Status: DC
  Administered 2015-01-02: 50 ug/h via INTRAVENOUS
  Filled 2015-01-02: qty 250

## 2015-01-02 MED ORDER — AMIODARONE HCL IN DEXTROSE 360-4.14 MG/200ML-% IV SOLN
30.0000 mg/h | INTRAVENOUS | Status: DC
  Administered 2015-01-02 (×2): 30 mg/h via INTRAVENOUS
  Filled 2015-01-02 (×2): qty 200

## 2015-01-02 MED ORDER — HEPARIN (PORCINE) IN NACL 100-0.45 UNIT/ML-% IJ SOLN
1000.0000 [IU]/h | INTRAMUSCULAR | Status: DC
  Administered 2015-01-03: 1000 [IU]/h via INTRAVENOUS
  Filled 2015-01-02 (×2): qty 250

## 2015-01-02 MED ORDER — FUROSEMIDE 10 MG/ML IJ SOLN
20.0000 mg | INTRAMUSCULAR | Status: AC
  Administered 2015-01-02: 20 mg via INTRAVENOUS
  Filled 2015-01-02: qty 2

## 2015-01-02 MED ORDER — MIDAZOLAM HCL 2 MG/2ML IJ SOLN
4.0000 mg | Freq: Once | INTRAMUSCULAR | Status: AC
  Administered 2015-01-02: 4 mg via INTRAVENOUS

## 2015-01-02 MED ORDER — VECURONIUM BROMIDE 10 MG IV SOLR
INTRAVENOUS | Status: AC
  Administered 2015-01-02: 10 mg via INTRAVENOUS
  Filled 2015-01-02: qty 10

## 2015-01-02 MED ORDER — VANCOMYCIN HCL IN DEXTROSE 750-5 MG/150ML-% IV SOLN
750.0000 mg | INTRAVENOUS | Status: DC
  Administered 2015-01-03: 750 mg via INTRAVENOUS
  Filled 2015-01-02 (×2): qty 150

## 2015-01-02 MED ORDER — ACETAMINOPHEN 325 MG PO TABS: 650.0000 mg | ORAL_TABLET | Freq: Four times a day (QID) | ORAL | Status: DC | PRN

## 2015-01-02 MED ORDER — AMIODARONE HCL IN DEXTROSE 360-4.14 MG/200ML-% IV SOLN
60.0000 mg/h | INTRAVENOUS | Status: AC
  Administered 2015-01-02: 60 mg/h via INTRAVENOUS
  Filled 2015-01-02: qty 200

## 2015-01-02 MED ORDER — AMIODARONE LOAD VIA INFUSION
150.0000 mg | Freq: Once | INTRAVENOUS | Status: AC
  Administered 2015-01-02: 150 mg via INTRAVENOUS
  Filled 2015-01-02: qty 83.34

## 2015-01-02 MED ORDER — BUDESONIDE 0.5 MG/2ML IN SUSP
0.5000 mg | Freq: Two times a day (BID) | RESPIRATORY_TRACT | Status: DC
  Administered 2015-01-02 – 2015-01-03 (×2): 0.5 mg via RESPIRATORY_TRACT
  Filled 2015-01-02 (×2): qty 2

## 2015-01-02 MED ORDER — NOREPINEPHRINE 4 MG/250ML-% IV SOLN
0.0000 ug/min | INTRAVENOUS | Status: DC
  Administered 2015-01-02: 5 ug/min via INTRAVENOUS
  Administered 2015-01-02: 32 ug/min via INTRAVENOUS
  Filled 2015-01-02: qty 250

## 2015-01-02 MED ORDER — ALBUTEROL SULFATE (2.5 MG/3ML) 0.083% IN NEBU: 2.5000 mg | INHALATION_SOLUTION | RESPIRATORY_TRACT | Status: DC | PRN

## 2015-01-02 MED ORDER — FENTANYL CITRATE (PF) 100 MCG/2ML IJ SOLN
100.0000 ug | Freq: Once | INTRAMUSCULAR | Status: AC
  Administered 2015-01-02: 100 ug via INTRAVENOUS

## 2015-01-02 MED ORDER — CHLORHEXIDINE GLUCONATE 0.12% ORAL RINSE (MEDLINE KIT): 15.0000 mL | Freq: Two times a day (BID) | OROMUCOSAL | Status: DC

## 2015-01-02 MED ORDER — ACETAMINOPHEN 650 MG RE SUPP
650.0000 mg | Freq: Four times a day (QID) | RECTAL | Status: DC | PRN
Start: 2015-01-02 — End: 2015-01-03

## 2015-01-02 MED ORDER — PANTOPRAZOLE SODIUM 40 MG IV SOLR
40.0000 mg | Freq: Every day | INTRAVENOUS | Status: DC
  Administered 2015-01-02: 40 mg via INTRAVENOUS
  Filled 2015-01-02: qty 40

## 2015-01-02 MED ORDER — FENTANYL CITRATE (PF) 100 MCG/2ML IJ SOLN
INTRAMUSCULAR | Status: AC
  Administered 2015-01-02: 100 ug via INTRAVENOUS
  Filled 2015-01-02: qty 4

## 2015-01-02 MED ORDER — LEVOTHYROXINE SODIUM 50 MCG PO TABS: 100.0000 ug | ORAL_TABLET | Freq: Every day | ORAL | Status: DC

## 2015-01-02 MED ORDER — FUROSEMIDE 10 MG/ML IJ SOLN
40.0000 mg | Freq: Once | INTRAMUSCULAR | Status: AC
  Administered 2015-01-02: 40 mg via INTRAVENOUS
  Filled 2015-01-02: qty 4

## 2015-01-02 MED ORDER — CHLORHEXIDINE GLUCONATE 0.12 % MT SOLN
15.0000 mL | Freq: Two times a day (BID) | OROMUCOSAL | Status: DC
  Administered 2015-01-02: 15 mL via OROMUCOSAL

## 2015-01-02 MED ORDER — VANCOMYCIN HCL IN DEXTROSE 1-5 GM/200ML-% IV SOLN
1000.0000 mg | Freq: Once | INTRAVENOUS | Status: AC
  Administered 2015-01-02: 1000 mg via INTRAVENOUS
  Filled 2015-01-02: qty 200

## 2015-01-02 MED ORDER — DEXTROSE 5 % IV SOLN
0.0000 ug/min | INTRAVENOUS | Status: DC
  Administered 2015-01-02: 15 ug/min via INTRAVENOUS
  Administered 2015-01-02: 40 ug/min via INTRAVENOUS
  Administered 2015-01-02: 5 ug/min via INTRAVENOUS
  Administered 2015-01-02: 30 ug/min via INTRAVENOUS

## 2015-01-02 MED ORDER — SODIUM BICARBONATE 8.4 % IV SOLN
INTRAVENOUS | Status: DC
  Administered 2015-01-02 – 2015-01-03 (×2): via INTRAVENOUS
  Filled 2015-01-02 (×8): qty 150

## 2015-01-02 MED ORDER — VECURONIUM BROMIDE 10 MG IV SOLR
INTRAVENOUS | Status: AC
  Filled 2015-01-02: qty 10

## 2015-01-02 MED ORDER — NOREPINEPHRINE BITARTRATE 1 MG/ML IV SOLN: 12.0000 ug/min | INTRAVENOUS | Status: DC

## 2015-01-02 MED ORDER — STERILE WATER FOR INJECTION IJ SOLN
INTRAMUSCULAR | Status: AC
  Administered 2015-01-02: 10 mL
  Filled 2015-01-02: qty 10

## 2015-01-02 MED ORDER — IPRATROPIUM-ALBUTEROL 0.5-2.5 (3) MG/3ML IN SOLN
3.0000 mL | RESPIRATORY_TRACT | Status: DC
  Administered 2015-01-02 – 2015-01-03 (×4): 3 mL via RESPIRATORY_TRACT
  Filled 2015-01-02 (×4): qty 3

## 2015-01-02 MED ORDER — VECURONIUM BROMIDE 10 MG IV SOLR
10.0000 mg | Freq: Once | INTRAVENOUS | Status: AC
  Administered 2015-01-02: 10 mg via INTRAVENOUS

## 2015-01-02 MED ORDER — ANTISEPTIC ORAL RINSE SOLUTION (CORINZ)
7.0000 mL | Freq: Four times a day (QID) | OROMUCOSAL | Status: DC
  Administered 2015-01-02 – 2015-01-03 (×3): 7 mL via OROMUCOSAL
  Filled 2015-01-02 (×7): qty 7

## 2015-01-02 MED ORDER — NOREPINEPHRINE 4 MG/250ML-% IV SOLN: 12.0000 ug/min | INTRAVENOUS | Status: DC

## 2015-01-02 MED ORDER — INSULIN ASPART 100 UNIT/ML ~~LOC~~ SOLN
0.0000 [IU] | Freq: Four times a day (QID) | SUBCUTANEOUS | Status: DC
  Administered 2015-01-02: 2 [IU] via SUBCUTANEOUS
  Administered 2015-01-03 (×2): 3 [IU] via SUBCUTANEOUS
  Filled 2015-01-02 (×2): qty 3
  Filled 2015-01-02: qty 2

## 2015-01-02 MED ORDER — PIPERACILLIN-TAZOBACTAM 3.375 G IVPB
3.3750 g | Freq: Three times a day (TID) | INTRAVENOUS | Status: DC
  Administered 2015-01-02 – 2015-01-03 (×3): 3.375 g via INTRAVENOUS
  Filled 2015-01-02 (×7): qty 50

## 2015-01-02 MED ORDER — MIDAZOLAM HCL 2 MG/2ML IJ SOLN
INTRAMUSCULAR | Status: AC
  Administered 2015-01-02: 4 mg via INTRAVENOUS
  Filled 2015-01-02: qty 4

## 2015-01-02 MED ORDER — STERILE WATER FOR INJECTION IJ SOLN
INTRAMUSCULAR | Status: AC
Start: 2015-01-02 — End: 2015-01-03
  Filled 2015-01-02: qty 10

## 2015-01-02 MED ORDER — INSULIN ASPART 100 UNIT/ML ~~LOC~~ SOLN: 0.0000 [IU] | Freq: Three times a day (TID) | SUBCUTANEOUS | Status: DC

## 2015-01-02 MED ORDER — VASOPRESSIN 20 UNIT/ML IV SOLN
0.0300 [IU]/min | INTRAVENOUS | Status: DC
  Administered 2015-01-02: 0.03 [IU]/min via INTRAVENOUS
  Filled 2015-01-02: qty 2

## 2015-01-02 MED ORDER — NOREPINEPHRINE 4 MG/250ML-% IV SOLN
INTRAVENOUS | Status: AC
  Administered 2015-01-02: 5 ug/min via INTRAVENOUS
  Filled 2015-01-02: qty 250

## 2015-01-02 MED ORDER — IPRATROPIUM-ALBUTEROL 0.5-2.5 (3) MG/3ML IN SOLN: 3.0000 mL | Freq: Four times a day (QID) | RESPIRATORY_TRACT | Status: DC

## 2015-01-02 NOTE — Progress Notes (Signed)
Pt intubated secondary to respiratory distress Dr Orvan Falconer at bedside and gave orders for intubation and 100 mcg fentanyl iv push once, versed 4 mg iv push once respiratory therapist attempted intubation however code blue called due to difficult intubation ER md placed endotracheal tube; Dr. Belia Heman notified and assessed pt at bedside given orders to administer 10 mg iv push vecuronium and 4 mg iv push versed due to tachypnea.  Orogastric tube placed per Dr Clovis Fredrickson verbal orders.  Vasopressin order placed per Dr Clovis Fredrickson orders due to hypotension with Levophed drip infusing.  ABG order placed results called to Dr. Belia Heman given orders to place orders for 150 meq Sodium Bicarb drip in D5W to infuse at 100 ml/hr

## 2015-01-02 NOTE — Progress Notes (Signed)
Pt placed on 50% VM due to low o2 sats on 6LNC. O2 sat 94%. Pt is restless, complaining of SOB and sweating.  Breath sounds: crackles throughout

## 2015-01-02 NOTE — Progress Notes (Signed)
CRITICAL VALUE ALERT  Critical value received:  Troponin 0.35  Date of notification:  01/02/2015  Time of notification:  1805  Critical value read back:yes   Nurse who received alert:  Derald Macleod  MD notified (1st page):  Dr. Welton Flakes  Time of first page:  1810  MD notified (2nd page):  Time of second page:  Responding MD:  Dr. Welton Flakes  Time MD responded:  [phone]

## 2015-01-02 NOTE — Progress Notes (Signed)
Called Critical result of lactic  Acid of 7.1 to Dr. Linward Natal. Received orders to increase bicarb drip to 150 cc/hr.

## 2015-01-02 NOTE — Progress Notes (Signed)
ANTICOAGULATION CONSULT NOTE - Initial Consult  Pharmacy Consult for Heparin Drip Management Indication: atrial fibrillation  Allergies  Allergen Reactions  . Sulfa Antibiotics Nausea Only    Patient Measurements: Height:  (165.1 cm) Weight: 160 lb 0.9 oz (72.6 kg) IBW/kg (Calculated) : 57   Vital Signs: BP: 114/59 mmHg (08/18 1230) Pulse Rate: 80 (08/18 1230)  Labs:  Recent Labs  01/07/2015 0428 01/02/15 0748  HGB 14.8 15.4  HCT 44.1 47.1*  PLT 105* 94*  CREATININE 1.44* 1.47*  TROPONINI 0.03  --     Estimated Creatinine Clearance: 27.9 mL/min (by C-G formula based on Cr of 1.47).   Medical History: Past Medical History  Diagnosis Date  . COPD (chronic obstructive pulmonary disease)   . Hypertension     Medications:  Scheduled:  . antiseptic oral rinse  7 mL Mouth Rinse QID  . budesonide (PULMICORT) nebulizer solution  0.5 mg Nebulization BID  . insulin aspart  0-9 Units Subcutaneous Q6H  . ipratropium-albuterol  3 mL Nebulization Q4H  . [START ON 01/23/15] levothyroxine  100 mcg Per Tube QAC breakfast  . methylPREDNISolone (SOLU-MEDROL) injection  60 mg Intravenous Q12H  . metoprolol tartrate  25 mg Oral Q6H  . mometasone-formoterol  2 puff Inhalation BID  . pantoprazole (PROTONIX) IV  40 mg Intravenous Daily  . piperacillin-tazobactam (ZOSYN)  IV  3.375 g Intravenous 3 times per day  . pravastatin  80 mg Oral Daily  . sodium chloride  3 mL Intravenous Q12H  . sterile water (preservative free)      . vancomycin  1,000 mg Intravenous Once  . [START ON 23-Jan-2015] vancomycin  750 mg Intravenous Q24H  . vecuronium       Infusions:  . amiodarone    . fentaNYL infusion INTRAVENOUS 50 mcg/hr (01/02/15 1501)  . [START ON January 23, 2015] heparin    . norepinephrine 32 mcg/min (01/02/15 1526)  . norepinephrine    .  sodium bicarbonate  infusion 1000 mL    . vasopressin (PITRESSIN) infusion - *FOR SHOCK*      Assessment: 81 yo female ICU patient  requiring mechanical ventilation. Patient received enoxaparin  SQ at 1048. Patient to be transitioned from enoxaparin to heparin drip.   Goal of Therapy:  Heparin level 0.3-0.7 units/ml Monitor platelets by anticoagulation protocol: Yes   Plan:  1. Heparin Drip: Will wait ~ 18 hours hours and begin heparin drip at 1000 units/hr without bolus. Will obtain initial anti-Xa at 1200, ~ 8 hours after initiation of heparin drip. Will obtain CBC with am labs.    2. Amiodarone Drip medication review: No further adjustments warranted at this time.    Pharmacy will continue to monitor and adjust per consult.    Simpson,Michael L 01/02/2015,4:19 PM

## 2015-01-02 NOTE — Progress Notes (Signed)
Breanna Ford is a 81 y.o. female  <principal problem not specified>   SUBJECTIVE: Pt much worse this Am with acute respiratory failure requiring face mask O2. Now in A-fib, with presumed CHF on top of her pneumonia. Pt c/o back pain and pleuritic CP. Echo and Cardiology consult still pending.  ______________________________________________________________________  ROS: Review of systems is unremarkable for any active cardiac,respiratory, GI, GU, hematologic, neurologic or psychiatric systems, 10 systems reviewed.  @  Past Medical History  Diagnosis Date  . COPD (chronic obstructive pulmonary disease)   . Hypertension     Past Surgical History  Procedure Laterality Date  . Tonsillectomy and adenoidectomy    . Cholecystectomy    . Abdominal hysterectomy      PHYSICAL EXAM:  BP 100/62 mmHg  Pulse 80  Temp(Src) 97.9 F (36.6 C) (Oral)  Resp 16  Ht  (1.651 m)  Wt 72.6 kg (160 lb 0.9 oz)  BMI 26.63 kg/m2  SpO2 96%  Wt Readings from Last 3 Encounters:  01/02/15 72.6 kg (160 lb 0.9 oz)            Constitutional: acutely ill appearing in  Moderate respiratory distress Neck: supple, no thyromegaly Respiratory: diffuse rhonchi with basilar rales Cardiovascular: irregularly irregular, no murmur, no gallop Abdomen: soft, good BS, nontender Extremities: trace edema Neuro: alert and oriented, no focal motor or sensory deficits  ASSESSMENT/PLAN:  Labs and imaging studies were reviewed  Labs pending. CXR today. Repeat IV Lasix x 1. Await Cardiology consult and echo results. Consult Pulmonology and PC. Switch to SQ Lovenox due to A-fib. Adjust cardiac meds. Continue IV ABX. Repeat labs in Am.

## 2015-01-02 NOTE — H&P (Signed)
Breanna Ford is a 81 y.o. female  [id number]  Primary Cardiologist: Adrian Blackwater Reason for Consultation: Atrial fibrillation  HPI: This 81 year old pleasant white female who presented to the emergency room with increasing fatigue cough shortness of breath palpitation and chest pain. In the emergency room she was placed on oxygen and was found to have left upper lobe pneumonia on chest x-ray. Patient also had atrial fibrillation on EKG. Patient this morning is feeling very short of breath and and is in A. fib with rapid ventricular response rate. Thus it was recommended to transfer the patient to ICU  Review of Systems: Review of Systems  Respiratory: Positive for cough, sputum production, shortness of breath and wheezing.   All other systems reviewed and are negative.     Past Medical History  Diagnosis Date  . COPD (chronic obstructive pulmonary disease)   . Hypertension     Medications Prior to Admission  Medication Sig Dispense Refill  . ADVAIR DISKUS 250-50 MCG/DOSE AEPB Inhale 1 puff into the lungs 2 (two) times daily.    Marland Kitchen albuterol (PROVENTIL HFA;VENTOLIN HFA) 108 (90 BASE) MCG/ACT inhaler Inhale 2 puffs into the lungs every 4 (four) hours as needed for wheezing or shortness of breath.    Marland Kitchen amLODipine (NORVASC) 5 MG tablet Take 1 tablet by mouth daily.    . busPIRone (BUSPAR) 15 MG tablet Take 15 mg by mouth 2 (two) times daily.    . diphenhydrAMINE (BENADRYL) 25 mg capsule Take 25 mg by mouth every evening.    . fluticasone (FLONASE) 50 MCG/ACT nasal spray Place 2 sprays into both nostrils daily.    . hydrochlorothiazide (HYDRODIURIL) 25 MG tablet Take 25 mg by mouth daily.    Marland Kitchen levothyroxine (SYNTHROID, LEVOTHROID) 100 MCG tablet Take 1 tablet by mouth daily.    Marland Kitchen LORazepam (ATIVAN) 0.5 MG tablet Take 1 tablet by mouth 2 (two) times daily as needed for anxiety.     Marland Kitchen losartan (COZAAR) 100 MG tablet Take 100 mg by mouth daily.    . metoprolol succinate (TOPROL-XL) 25  MG 24 hr tablet Take 1 tablet by mouth daily.    . pravastatin (PRAVACHOL) 80 MG tablet Take 1 tablet by mouth daily.       Marland Kitchen amiodarone  150 mg Intravenous Once  . azithromycin  500 mg Intravenous Q24H  . busPIRone  15 mg Oral BID  . cefTRIAXone (ROCEPHIN)  IV  1 g Intravenous Q24H  . diphenhydrAMINE  25 mg Oral QPM  . docusate sodium  100 mg Oral BID  . enoxaparin (LOVENOX) injection  1 mg/kg Subcutaneous q morning - 10a  . fluticasone  2 spray Each Nare Daily  . guaiFENesin  600 mg Oral BID  . hydrochlorothiazide  25 mg Oral Daily  . insulin aspart  0-9 Units Subcutaneous TID WC  . ipratropium-albuterol  3 mL Nebulization QID  . levothyroxine  100 mcg Oral QAC breakfast  . methylPREDNISolone (SOLU-MEDROL) injection  60 mg Intravenous Q12H  . metoprolol tartrate  25 mg Oral Q6H  . mometasone-formoterol  2 puff Inhalation BID  . pravastatin  80 mg Oral Daily  . sodium chloride  3 mL Intravenous Q12H    Infusions: . amiodarone     Followed by  . amiodarone      Allergies  Allergen Reactions  . Sulfa Antibiotics Nausea Only    Social History   Social History  . Marital Status: Widowed    Spouse Name: N/A  .  Number of Children: N/A  . Years of Education: N/A   Occupational History  . Not on file.   Social History Main Topics  . Smoking status: Former Games developer  . Smokeless tobacco: Not on file  . Alcohol Use: No  . Drug Use: Not on file  . Sexual Activity: Not on file   Other Topics Concern  . Not on file   Social History Narrative  . No narrative on file    Family History  Problem Relation Age of Onset  .       PHYSICAL EXAM: Filed Vitals:   01/02/15 0123  BP: 100/62  Pulse: 80  Temp: 97.9 F (36.6 C)  Resp: 16     Intake/Output Summary (Last 24 hours) at 01/02/15 0923 Last data filed at 01/02/15 4098  Gross per 24 hour  Intake   1576 ml  Output   1750 ml  Net   -174 ml    General:  Well appearing. No respiratory difficulty HEENT:  normal Neck: supple. no JVD. Carotids 2+ bilat; no bruits. No lymphadenopathy or thryomegaly appreciated. Cor: PMI nondisplaced. Regular rate & rhythm. No rubs, gallops or murmurs. Lungs: clear Abdomen: soft, nontender, nondistended. No hepatosplenomegaly. No bruits or masses. Good bowel sounds. Extremities: no cyanosis, clubbing, rash, edema Neuro: alert & oriented x 3, cranial nerves grossly intact. moves all 4 extremities w/o difficulty. Affect pleasant.  ECG: A. fib with rapid ventricular response rate  Results for orders placed or performed during the hospital encounter of 01/05/2015 (from the past 24 hour(s))  Culture, expectorated sputum-assessment     Status: None   Collection Time: 12/24/2014 11:49 AM  Result Value Ref Range   Specimen Description SPUTUM    Special Requests NONE    Sputum evaluation      Sputum specimen not acceptable for testing.  Please recollect.     Report Status 01/09/2015 FINAL   Urinalysis complete, with microscopic ([facility] only)     Status: Abnormal   Collection Time: 01/11/2015  5:53 PM  Result Value Ref Range   Color, Urine YELLOW (A) YELLOW   APPearance CLEAR (A) CLEAR   Glucose, UA NEGATIVE NEGATIVE mg/dL   Bilirubin Urine NEGATIVE NEGATIVE   Ketones, ur NEGATIVE NEGATIVE mg/dL   Specific Gravity, Urine 1.015 1.005 - 1.030   Hgb urine dipstick 1+ (A) NEGATIVE   pH 6.0 5.0 - 8.0   Protein, ur NEGATIVE NEGATIVE mg/dL   Nitrite POSITIVE (A) NEGATIVE   Leukocytes, UA NEGATIVE NEGATIVE   RBC / HPF 0-5 0 - 5 RBC/hpf   WBC, UA 0-5 0 - 5 WBC/hpf   Bacteria, UA RARE (A) NONE SEEN   Squamous Epithelial / LPF 0-5 (A) NONE SEEN   Mucous PRESENT   CBC with Differential/Platelet     Status: Abnormal   Collection Time: 01/02/15  7:48 AM  Result Value Ref Range   WBC 5.7 3.6 - 11.0 K/uL   RBC 4.69 3.80 - 5.20 MIL/uL   Hemoglobin 15.4 12.0 - 16.0 g/dL   HCT 11.9 (H) 14.7 - 82.9 %   MCV 100.4 (H) 80.0 - 100.0 fL   MCH 32.9 26.0 - 34.0 pg   MCHC 32.8 32.0 -  36.0 g/dL   RDW 56.2 13.0 - 86.5 %   Platelets 94 (L) 150 - 440 K/uL   Neutrophils Relative % 29 (L) 43 - 77 %   Lymphocytes Relative 21 12 - 46 %   Monocytes Relative 14 (H) 3 - 12 %  Eosinophils Relative 0 0 - 5 %   Basophils Relative 0 0 - 1 %   Band Neutrophils 36 (H) 0 - 10 %   Metamyelocytes Relative 0 %   Myelocytes 0 %   Promyelocytes Absolute 0 %   Blasts 0 %   nRBC 0 0 /100 WBC   Other 0 %   Neutro Abs 3.7 1.7 - 7.7 K/uL   Lymphs Abs 1.2 0.7 - 4.0 K/uL   Monocytes Absolute 0.8 0.1 - 1.0 K/uL   Eosinophils Absolute 0.0 0.0 - 0.7 K/uL   Basophils Absolute 0.0 0.0 - 0.1 K/uL   Smear Review MORPHOLOGY UNREMARKABLE   Comprehensive metabolic panel     Status: Abnormal   Collection Time: 01/02/15  7:48 AM  Result Value Ref Range   Sodium 133 (L) 135 - 145 mmol/L   Potassium 4.5 3.5 - 5.1 mmol/L   Chloride 102 101 - 111 mmol/L   CO2 18 (L) 22 - 32 mmol/L   Glucose, Bld 131 (H) 65 - 99 mg/dL   BUN 23 (H) 6 - 20 mg/dL   Creatinine, Ser 1.61 (H) 0.44 - 1.00 mg/dL   Calcium 8.4 (L) 8.9 - 10.3 mg/dL   Total Protein 6.1 (L) 6.5 - 8.1 g/dL   Albumin 3.2 (L) 3.5 - 5.0 g/dL   AST 47 (H) 15 - 41 U/L   ALT 18 14 - 54 U/L   Alkaline Phosphatase 16 (L) 38 - 126 U/L   Total Bilirubin 0.5 0.3 - 1.2 mg/dL   GFR calc non Af Amer 31 (L) >60 mL/min   GFR calc Af Amer 36 (L) >60 mL/min   Anion gap 13 5 - 15   Dg Chest 2 View  01/02/2015   CLINICAL DATA:  COPD.  EXAM: CHEST  2 VIEW  COMPARISON:  2015-01-04 .  FINDINGS: Mediastinum and hilar structures are normal. Progressive left upper lobe infiltrate and atelectasis. Interim appearance of diffuse left lower lobe infiltrate is present. Left pleural effusion. Right lung is clear. No pneumothorax. Heart size normal. Prior cervical spine fusion.  IMPRESSION: 1. Progressive left upper lobe infiltrate and atelectasis. Interim appearance of diffuse left lower lobe infiltrate.  2.  Small left pleural effusion.   Electronically Signed   By: Maisie Fus   Register   On: 01/02/2015 07:47   Dg Chest Port 1 View  04-Jan-2015   CLINICAL DATA:  Initial evaluation for acute shortness of breath, cough, left-sided chest pain. History of COPD.  EXAM: PORTABLE CHEST - 1 VIEW  COMPARISON:  Prior radiograph from 07/20/2014  FINDINGS: Cardiac and mediastinal silhouettes are stable in size and contour, and remain within normal limits.  Lungs are normally inflated. Changes related COPD noted. There is patchy multi focal infiltrate within the medial left upper lobe, suspicious for pneumonia in the setting of cough. No other focal infiltrates. No pulmonary edema or pleural effusion. No pneumothorax.  No acute osseous abnormality.  IMPRESSION: 1. Findings concerning for acute left upper lobe pneumonia. 2. COPD.   Electronically Signed   By: Rise Mu M.D.   On: Jan 04, 2015 05:16     ASSESSMENT AND PLAN: Atrial fibrillation with rapid ventricular response rate and findings of CHF/pneumonia. Advise starting the patient on IV amiodarone. Echocardiogram showed ejection fraction 55%. Will give Lasix also has appears to be in CHF and follow the patient closely with you. Thank you.  Jonea Bukowski A

## 2015-01-02 NOTE — Progress Notes (Signed)
ANTIBIOTIC CONSULT NOTE - INITIAL  Pharmacy Consult for Vancomycin/Zosyn Indication: rule out sepsis  Allergies  Allergen Reactions  . Sulfa Antibiotics Nausea Only    Patient Measurements: Height: 5\' 5"  (165.1 cm) Weight: 160 lb 0.9 oz (72.6 kg) IBW/kg (Calculated) : 57   Vital Signs: BP: 114/59 mmHg (08/18 1230) Pulse Rate: 80 (08/18 1230) Intake/Output from previous day: 08/17 0701 - 08/18 0700 In: 1696 [P.O.:120; I.V.:1326; IV Piggyback:250] Out: 1750 [Urine:1750] Intake/Output from this shift: Total I/O In: 336.4 [I.V.:86.4; IV Piggyback:250] Out: 875 [Urine:875]  Labs:  Recent Labs  01/06/2015 0428 01/02/15 0748  WBC 8.7 5.7  HGB 14.8 15.4  PLT 105* 94*  CREATININE 1.44* 1.47*   Estimated Creatinine Clearance: 27.9 mL/min (by C-G formula based on Cr of 1.47). No results for input(s): VANCOTROUGH, VANCOPEAK, VANCORANDOM, GENTTROUGH, GENTPEAK, GENTRANDOM, TOBRATROUGH, TOBRAPEAK, TOBRARND, AMIKACINPEAK, AMIKACINTROU, AMIKACIN in the last 72 hours.   Microbiology: Recent Results (from the past 720 hour(s))  Culture, blood (routine x 2)     Status: None (Preliminary result)   Collection Time: 12/24/2014  5:34 AM  Result Value Ref Range Status   Specimen Description BLOOD RIGHT ANTECUBITAL  Final   Special Requests BOTTLES DRAWN AEROBIC AND ANAEROBIC  Final   Culture NO GROWTH 1 DAY  Final   Report Status PENDING  Incomplete  Culture, blood (routine x 2)     Status: None (Preliminary result)   Collection Time: 12/28/2014  5:35 AM  Result Value Ref Range Status   Specimen Description BLOOD BLOOD LEFT FOREARM  Final   Special Requests BOTTLES DRAWN AEROBIC AND ANAEROBIC  Final   Culture NO GROWTH 1 DAY  Final   Report Status PENDING  Incomplete  Culture, expectorated sputum-assessment     Status: None   Collection Time: 01/06/2015 11:49 AM  Result Value Ref Range Status   Specimen Description SPUTUM  Final   Special Requests NONE  Final   Sputum evaluation    Final    Sputum specimen not acceptable for testing.  Please recollect.     Report Status 12/26/2014 FINAL  Final  MRSA PCR Screening     Status: None   Collection Time: 01/02/15 11:35 AM  Result Value Ref Range Status   MRSA by PCR NEGATIVE NEGATIVE Final    Comment:        The GeneXpert MRSA Assay (FDA approved for NASAL specimens only), is one component of a comprehensive MRSA colonization surveillance program. It is not intended to diagnose MRSA infection nor to guide or monitor treatment for MRSA infections.     Medical History: Past Medical History  Diagnosis Date  . COPD (chronic obstructive pulmonary disease)   . Hypertension     Medications:  Scheduled:  . antiseptic oral rinse  7 mL Mouth Rinse QID  . budesonide (PULMICORT) nebulizer solution  0.5 mg Nebulization BID  . enoxaparin (LOVENOX) injection  1 mg/kg Subcutaneous q morning - 10a  . insulin aspart  0-9 Units Subcutaneous Q6H  . ipratropium-albuterol  3 mL Nebulization Q4H  . [START ON 01/11/15] levothyroxine  100 mcg Per Tube QAC breakfast  . methylPREDNISolone (SOLU-MEDROL) injection  60 mg Intravenous Q12H  . metoprolol tartrate  25 mg Oral Q6H  . mometasone-formoterol  2 puff Inhalation BID  . pantoprazole (PROTONIX) IV  40 mg Intravenous Daily  . piperacillin-tazobactam (ZOSYN)  IV  3.375 g Intravenous 3 times per day  . pravastatin  80 mg Oral Daily  . sodium chloride  3 mL Intravenous Q12H  . sterile water (preservative free)      . vancomycin  1,000 mg Intravenous Once  . [START ON 01/29/2015] vancomycin  750 mg Intravenous Q24H  . vecuronium       Infusions:  . amiodarone    . fentaNYL infusion INTRAVENOUS 50 mcg/hr (01/02/15 1501)  . norepinephrine 32 mcg/min (01/02/15 1526)  . norepinephrine    .  sodium bicarbonate  infusion 1000 mL    . vasopressin (PITRESSIN) infusion - *FOR SHOCK*     Assessment: 81 yo female ICU patient being treated for severe sepsis. Patient currently  requiring mechanical ventilation. Patient previously received 2 days of azithromycin and ceftriaxone.   Goal of Therapy:  Vancomycin trough level 15-20 mcg/ml  Plan:  1. Vancomycin: Will order vancomycin 1g IV x 1. Will order stacked dose of vancomycin  IV Q24hr starting at 0100 on 8/19. Goal trough is 15-20. Will obtain trough prior to dose on 8/22.   2. Zosyn: Will start patient on Zosyn 3.375g IV Q8hr.   Patient's blood cultures now obtained. Will follow-up culture results. Recommend obtaining sputum culture.   Pharmacy will continue to monitor and adjust per consult.   Arlene Brickel L 01/02/2015,3:54 PM

## 2015-01-02 NOTE — Progress Notes (Signed)
SUBJECTIVE:  Patient got intubated  Filed Vitals:   01/02/15 1000 01/02/15 1100 01/02/15 1200 01/02/15 1230  BP: 131/66 124/73 85/54 114/59  Pulse: 122 123 83 80  Temp:      TempSrc:      Resp: 33 36 28 27  Height:      Weight:      SpO2: 89% 84% 91% 87%    Intake/Output Summary (Last 24 hours) at 01/02/15 1555 Last data filed at 01/02/15 1121  Gross per 24 hour  Intake 1912.35 ml  Output   2625 ml  Net -712.65 ml    LABS: Basic Metabolic Panel:  Recent Labs  91/47/82 0428 01/02/15 0748  NA 136 133*  K 3.8 4.5  CL 104 102  CO2 22 18*  GLUCOSE 180* 131*  BUN 16 23*  CREATININE 1.44* 1.47*  CALCIUM 9.1 8.4*   Liver Function Tests:  Recent Labs  01-15-15 0428 01/02/15 0748  AST 50* 47*  ALT 25 18  ALKPHOS 47 16*  BILITOT 1.0 0.5  PROT 6.8 6.1*  ALBUMIN 4.0 3.2*   No results for input(s): LIPASE, AMYLASE in the last 72 hours. CBC:  Recent Labs  2015-01-15 0428 01/02/15 0748  WBC 8.7 5.7  NEUTROABS 7.6 3.7  HGB 14.8 15.4  HCT 44.1 47.1*  MCV 98.2 100.4*  PLT 105* 94*   Cardiac Enzymes:  Recent Labs  2015/01/15 0428  TROPONINI 0.03   BNP: Invalid input(s): POCBNP D-Dimer: No results for input(s): DDIMER in the last 72 hours. Hemoglobin A1C:  Recent Labs  2015/01/15 0721  HGBA1C 5.3   Fasting Lipid Panel: No results for input(s): CHOL, HDL, LDLCALC, TRIG, CHOLHDL, LDLDIRECT in the last 72 hours. Thyroid Function Tests:  Recent Labs  2015-01-15 0428  TSH 1.623   Anemia Panel: No results for input(s): VITAMINB12, FOLATE, FERRITIN, TIBC, IRON, RETICCTPCT in the last 72 hours.   PHYSICAL EXAM General: Well developed, well nourished, in no acute distress HEENT:  Normocephalic and atramatic Neck:  No JVD.  Lungs: Clear bilaterally to auscultation and percussion. Heart: HRRR . Normal S1 and S2 without gallops or murmurs.  Abdomen: Bowel sounds are positive, abdomen soft and non-tender  Msk:  Back normal, normal gait. Normal strength  and tone for age. Extremities: No clubbing, cyanosis or edema.   Neuro: Alert and oriented X 3. Psych:  Good affect, responds appropriately  TELEMETRY: Monitor shows atrial fibrillation at a ventricular response rate of 78/m  ASSESSMENT AND PLAN: Respiratory failure and hypotension and pneumonia with possible septic shock. Continue amiodarone amiodarone drip as is still in atrial fibrillation agree with vasopressors. We will also get cardiac enzymes 3 with troponin. Also we will get EKG in the morning.  Active Problems:   CAP (community acquired pneumonia)   SIRS (systemic inflammatory response syndrome)   Septic shock   COPD exacerbation   Acute respiratory failure    Devorah Givhan A, MD, [facility] 01/02/2015 3:55 PM

## 2015-01-02 NOTE — Consult Note (Signed)
PULMONARY / CRITICAL CARE MEDICINE   Name: Breanna Ford MRN: [id number] DOB: 03-09-1928    ADMISSION DATE:  12/31/2014    CHIEF COMPLAINT:   Acute resp failure   HISTORY OF PRESENT ILLNESS  81 yo white female transferred to ICU for acute resp failure, patient is known to Dr. Meredeth Ide Patient with acute resp distress, with difficult intubation, patient was in severe resp distress  It is noted that patient had afib with RVR along with therapy for pneumonia Patient is now intubated, sedated   SIGNIFICANT EVENTS   Intubated 8/18   PAST MEDICAL HISTORY    :  Past Medical History  Diagnosis Date  . COPD (chronic obstructive pulmonary disease)   . Hypertension    Past Surgical History  Procedure Laterality Date  . Tonsillectomy and adenoidectomy    . Cholecystectomy    . Abdominal hysterectomy     Prior to Admission medications   Medication Sig Start Date End Date Taking? Authorizing Provider  ADVAIR DISKUS 250-50 MCG/DOSE AEPB Inhale 1 puff into the lungs 2 (two) times daily.   Yes Historical Provider, MD  albuterol (PROVENTIL HFA;VENTOLIN HFA) 108 (90 BASE) MCG/ACT inhaler Inhale 2 puffs into the lungs every 4 (four) hours as needed for wheezing or shortness of breath.   Yes Historical Provider, MD  amLODipine (NORVASC) 5 MG tablet Take 1 tablet by mouth daily.   Yes Historical Provider, MD  busPIRone (BUSPAR) 15 MG tablet Take 15 mg by mouth 2 (two) times daily.   Yes Historical Provider, MD  diphenhydrAMINE (BENADRYL) 25 mg capsule Take 25 mg by mouth every evening.   Yes Historical Provider, MD  fluticasone (FLONASE) 50 MCG/ACT nasal spray Place 2 sprays into both nostrils daily.   Yes Historical Provider, MD  hydrochlorothiazide (HYDRODIURIL) 25 MG tablet Take 25 mg by mouth daily.   Yes Historical Provider, MD  levothyroxine (SYNTHROID, LEVOTHROID) 100 MCG tablet Take 1 tablet by mouth daily.   Yes Historical Provider, MD  LORazepam (ATIVAN) 0.5 MG tablet Take 1  tablet by mouth 2 (two) times daily as needed for anxiety.    Yes Historical Provider, MD  losartan (COZAAR) 100 MG tablet Take 100 mg by mouth daily.   Yes Historical Provider, MD  metoprolol succinate (TOPROL-XL) 25 MG 24 hr tablet Take 1 tablet by mouth daily.   Yes Historical Provider, MD  pravastatin (PRAVACHOL) 80 MG tablet Take 1 tablet by mouth daily.   Yes Historical Provider, MD   Allergies  Allergen Reactions  . Sulfa Antibiotics Nausea Only     FAMILY HISTORY   Family History  Problem Relation Age of Onset  .         SOCIAL HISTORY    reports that she has quit smoking. She does not have any smokeless tobacco history on file. She reports that she does not drink alcohol. Her drug history is not on file.  Review of Systems  Unable to perform ROS: critical illness      VITAL SIGNS    Temp:  [97.6 F (36.4 C)-97.9 F (36.6 C)] 97.9 F (36.6 C) (08/18 0123) Pulse Rate:  [80-123] 80 (08/18 1230) Resp:  [16-36] 27 (08/18 1230) BP: (85-134)/(54-74) 114/59 mmHg (08/18 1230) SpO2:  [84 %-100 %] 87 % (08/18 1230) FiO2 (%):  [50 %] 50 % (08/18 0754) Weight:  [160 lb 0.9 oz (72.6 kg)] 160 lb 0.9 oz (72.6 kg) (08/18 0622) HEMODYNAMICS:   VENTILATOR SETTINGS: Vent Mode:  [-]  FiO2 (%):  [  50 %] 50 % INTAKE / OUTPUT:  Intake/Output Summary (Last 24 hours) at 01/02/15 1438 Last data filed at 01/02/15 1121  Gross per 24 hour  Intake 1912.35 ml  Output   2625 ml  Net -712.65 ml       PHYSICAL EXAM   Physical Exam  Constitutional: She appears distressed.  HENT:  Head: Normocephalic and atraumatic.  Eyes: Pupils are equal, round, and reactive to light. No scleral icterus.  Neck: Normal range of motion. Neck supple.  Cardiovascular: Normal rate and regular rhythm.   No murmur heard. Pulmonary/Chest: She is in respiratory distress. She has wheezes. She has rales.  resp distress  Abdominal: Soft. She exhibits no distension. There is no tenderness.   Musculoskeletal: She exhibits no edema.  Neurological: She displays normal reflexes. Coordination normal.  gcs<8T  Skin: Skin is warm. No rash noted. She is diaphoretic.       LABS   LABS:  CBC  Recent Labs Lab 01/29/15 0428 01/02/15 0748  WBC 8.7 5.7  HGB 14.8 15.4  HCT 44.1 47.1*  PLT 105* 94*   Coag's No results for input(s): APTT, INR in the last 168 hours. BMET  Recent Labs Lab 01-29-2015 0428 01/02/15 0748  NA 136 133*  K 3.8 4.5  CL 104 102  CO2 22 18*  BUN 16 23*  CREATININE 1.44* 1.47*  GLUCOSE 180* 131*   Electrolytes  Recent Labs Lab 29-Jan-2015 0428 01/02/15 0748  CALCIUM 9.1 8.4*   Sepsis Markers No results for input(s): LATICACIDVEN, PROCALCITON, O2SATVEN in the last 168 hours. ABG No results for input(s): PHART, PCO2ART, PO2ART in the last 168 hours. Liver Enzymes  Recent Labs Lab January 29, 2015 0428 01/02/15 0748  AST 50* 47*  ALT 25 18  ALKPHOS 47 16*  BILITOT 1.0 0.5  ALBUMIN 4.0 3.2*   Cardiac Enzymes  Recent Labs Lab Jan 29, 2015 0428  TROPONINI 0.03   Glucose  Recent Labs Lab 01/02/15 0942  GLUCAP 128*     Recent Results (from the past 240 hour(s))  Culture, blood (routine x 2)     Status: None (Preliminary result)   Collection Time: 01-29-2015  5:34 AM  Result Value Ref Range Status   Specimen Description BLOOD RIGHT ANTECUBITAL  Final   Special Requests BOTTLES DRAWN AEROBIC AND ANAEROBIC  Final   Culture NO GROWTH 1 DAY  Final   Report Status PENDING  Incomplete  Culture, blood (routine x 2)     Status: None (Preliminary result)   Collection Time: 01/29/2015  5:35 AM  Result Value Ref Range Status   Specimen Description BLOOD BLOOD LEFT FOREARM  Final   Special Requests BOTTLES DRAWN AEROBIC AND ANAEROBIC  Final   Culture NO GROWTH 1 DAY  Final   Report Status PENDING  Incomplete  Culture, expectorated sputum-assessment     Status: None   Collection Time: 01/29/15 11:49 AM  Result Value Ref Range Status    Specimen Description SPUTUM  Final   Special Requests NONE  Final   Sputum evaluation   Final    Sputum specimen not acceptable for testing.  Please recollect.     Report Status 01/29/2015 FINAL  Final  MRSA PCR Screening     Status: None   Collection Time: 01/02/15 11:35 AM  Result Value Ref Range Status   MRSA by PCR NEGATIVE NEGATIVE Final    Comment:        The GeneXpert MRSA Assay (FDA approved for NASAL specimens only), is  one component of a comprehensive MRSA colonization surveillance program. It is not intended to diagnose MRSA infection nor to guide or monitor treatment for MRSA infections.      Current facility-administered medications:  .  acetaminophen (TYLENOL) tablet 650 mg, 650 mg, Per Tube, Q6H PRN **OR** acetaminophen (TYLENOL) suppository 650 mg, 650 mg, Rectal, Q6H PRN, Suan Halter, MD .  amiodarone (NEXTERONE PREMIX) 360 MG/200ML (1.8 mg/mL) IV infusion, 60 mg/hr, Intravenous, Continuous, Last Rate: 33.3 mL/hr at 01/02/15 1121, 60 mg/hr at 01/02/15 1121 **FOLLOWED BY** amiodarone (NEXTERONE PREMIX) 360 MG/200ML (1.8 mg/mL) IV infusion, 30 mg/hr, Intravenous, Continuous, Rise Mu, PA-C .  antiseptic oral rinse solution (CORINZ), 7 mL, Mouth Rinse, QID, Suan Halter, MD .  budesonide (PULMICORT) nebulizer solution 0.5 mg, 0.5 mg, Nebulization, BID, Erin Fulling, MD .  enoxaparin (LOVENOX) injection 75 mg, 1 mg/kg, Subcutaneous, q morning - 10a, Marguarite Arbour, MD, 75 mg at 01/02/15 1049 .  fentaNYL (SUBLIMAZE) 100 MCG/2ML injection, , , ,  .  fentaNYL (SUBLIMAZE) injection 100 mcg, 100 mcg, Intravenous, Once, Suan Halter, MD .  fentaNYL in NS (58mcg/ml) infusion-PREMIX, 10 mcg/hr, Intravenous, Continuous, Erin Fulling, MD .  insulin aspart (novoLOG) injection 0-9 Units, 0-9 Units, Subcutaneous, Q6H, Suan Halter, MD .  ipratropium-albuterol (DUONEB) 0.5-2.5 (3) MG/3ML nebulizer solution 3 mL, 3 mL, Nebulization,  Q4H, Erin Fulling, MD .  Melene Muller ON 01-31-2015] levothyroxine (SYNTHROID, LEVOTHROID) tablet 100 mcg, 100 mcg, Per Tube, QAC breakfast, Suan Halter, MD .  methylPREDNISolone sodium succinate (SOLU-MEDROL) 125 mg/2 mL injection 60 mg, 60 mg, Intravenous, Q12H, Marguarite Arbour, MD, 60 mg at 01/02/15 0834 .  metoprolol tartrate (LOPRESSOR) tablet 25 mg, 25 mg, Oral, Q6H, Marguarite Arbour, MD, 25 mg at 01/02/15 9147 .  midazolam (VERSED) 2 MG/2ML injection, , , ,  .  midazolam (VERSED) 2 MG/2ML injection, , , ,  .  midazolam (VERSED) injection 4 mg, 4 mg, Intravenous, Once, Suan Halter, MD .  midazolam (VERSED) injection 4 mg, 4 mg, Intravenous, Once, Erin Fulling, MD .  mometasone-formoterol [facility]) 100-5 MCG/ACT inhaler 2 puff, 2 puff, Inhalation, BID, Arnaldo Natal, MD, 2 puff at 12/24/2014 2125 .  morphine 2 MG/ML injection 1 mg, 1 mg, Intravenous, Q4H PRN, Arnaldo Natal, MD, 1 mg at 01/02/15 0244 .  norepinephrine (LEVOPHED) 4mg  in D5W premix infusion, 0-40 mcg/min, Intravenous, Titrated, Marguarite Arbour, MD .  norepinephrine (LEVOPHED) 4mg /248mL infusion, , , ,  .  ondansetron (ZOFRAN) tablet 4 mg, 4 mg, Oral, Q6H PRN **OR** ondansetron (ZOFRAN) injection 4 mg, 4 mg, Intravenous, Q6H PRN, Arnaldo Natal, MD .  pantoprazole (PROTONIX) injection 40 mg, 40 mg, Intravenous, Daily, Suan Halter, MD .  pravastatin (PRAVACHOL) tablet 80 mg, 80 mg, Oral, Daily, Arnaldo Natal, MD, 80 mg at 01/02/15 1050 .  sodium chloride 0.9 % injection 3 mL, 3 mL, Intravenous, Q12H, Arnaldo Natal, MD, 3 mL at 01/02/15 1000 .  sterile water (preservative free) injection, , , ,  .  sterile water (preservative free) injection, , , ,  .  vecuronium (NORCURON) 10 MG injection, , , ,  .  vecuronium (NORCURON) 10 MG injection, , , ,  .  vecuronium (NORCURON) injection 10 mg, 10 mg, Intravenous, Once, Erin Fulling, MD  IMAGING    Dg Chest 2 View  01/02/2015   CLINICAL DATA:   COPD.  EXAM: CHEST  2 VIEW  COMPARISON:  12/21/2014 .  FINDINGS: Mediastinum and hilar structures are normal. Progressive left upper lobe infiltrate and atelectasis. Interim appearance of diffuse left lower lobe infiltrate is present. Left pleural effusion. Right lung is clear. No pneumothorax. Heart size normal. Prior cervical spine fusion.  IMPRESSION: 1. Progressive left upper lobe infiltrate and atelectasis. Interim appearance of diffuse left lower lobe infiltrate.  2.  Small left pleural effusion.   Electronically Signed   By: Maisie Fus  Register   On: 01/02/2015 07:47   Dg Chest Port 1 View  01/02/2015   CLINICAL DATA:  Central line placement  EXAM: PORTABLE CHEST - 1 VIEW  COMPARISON:  01/24/15  FINDINGS: Cardiomediastinal silhouette is stable. There is right IJ central line with tip in SVC right atrium junction. No pneumothorax. Persistent left lung airspace is and left upper lobe consolidation.  IMPRESSION: There is right IJ central line with tip in SVC right atrium junction. No pneumothorax. Persistent left lung airspace is and left upper lobe consolidation.   Electronically Signed   By: Natasha Mead M.D.   On: 01/02/2015 10:21      Indwelling Urinary Catheter continued, requirement due to   Reason to continue Indwelling Urinary Catheter for strict Intake/Output monitoring for hemodynamic instability   Central Line continued, requirement due to   Reason to continue [employer] Monitoring of central venous pressure or other hemodynamic parameters   Ventilator continued, requirement due to, resp failure    Ventilator Sedation RASS 0 to -2   MAJOR EVENTS/TEST RESULTS: Intubated 8/18 CXR c/w pneumonia  INDWELLING DEVICES:: RT CVL 8/18>>  MICRO DATA: MRSA PCR negative Urine  Blood Resp   ANTIMICROBIALS: Vancomycin and Zosyn day 1    ASSESSMENT/PLAN   81 yo white female admitted to ICU for acute and severe resp failure from acute COPD exacerbation with acute Pneumonia with  acute afib with RVR with septic shock   PULMONARY 1.Respiratory Failure -continue Full MV support -continue Bronchodilator Therapy -Wean Fio2 and PEEP as tolerated -COPD exacerbation -BD therapy, IV steroids, IV abx   CARDIOVASCULAR Septic shock -use vasopressors to keep MAP>65 -follow ABG and LA -follow up cultures -emperic ABX   RENAL Renal Failure-most likely due to ATN -follow chem 7 -follow UO -continue Foley Catheter-assess need   GASTROINTESTINAL Place OG-place to suction GI prx  HEMATOLOGIC Follow h/h  INFECTIOUS Pneumonia -follow up cultures Empiric abx  ENDOCRINE - ICU hypoglycemic\Hyperglycemia protocol   NEUROLOGIC - intubated and sedated - minimal sedation to achieve a RASS goal: -1    I have personally obtained a history, examined the patient, evaluated laboratory and independently reviewed  imaging results, formulated the assessment and plan and placed orders.  The Patient requires high complexity decision making for assessment and support, frequent evaluation and titration of therapies, application of advanced monitoring technologies and extensive interpretation of multiple databases. Critical Care Time devoted to patient care services described in this note is 45 minutes.   Overall, patient is critically ill, prognosis is guarded. Patient at high risk for cardiac arrest and death.    Lucie Leather, M.D.  [facility]  Medical Director [facility] Medical Director [facility] Cardio-Pulmonary Department

## 2015-01-02 NOTE — Progress Notes (Signed)
Patient transferred to ICU as ordered, report given to [place]. Patient having increased respiratory distress. And continues to be on face mask O2. Chest Xray done this AM. Dr. Judithann Sheen in this morning and ordered patient to be transferred. Cardiology consult done. Patient is alert and oriented. Family at the bedside.

## 2015-01-02 NOTE — ED Provider Notes (Signed)
[facility]  Department of Emergency Medicine   Code Blue CONSULT NOTE  Chief Complaint: Cardiac arrest/unresponsive   Level V Caveat: Unresponsive  History of present illness: I was contacted by the hospital for a CODE BLUE cardiac arrest upstairs and presented to the patient's bedside.  Patient apparently been admitted for pneumonia and shortness of breath. Palliative care M.D. was there and had been trying to have patient intubated by respiratory therapy although they were unable to obtain the airway. They asked me to intubate the patient. Patient was actually not a a cardiac arrest rather more of a respiratory difficulty.  ROS: Unable to obtain, Level V caveat  Scheduled Meds: . antiseptic oral rinse  7 mL Mouth Rinse QID  . budesonide (PULMICORT) nebulizer solution  0.5 mg Nebulization BID  . enoxaparin (LOVENOX) injection  1 mg/kg Subcutaneous q morning - 10a  . insulin aspart  0-9 Units Subcutaneous Q6H  . ipratropium-albuterol  3 mL Nebulization Q4H  . [START ON 01/09/15] levothyroxine  100 mcg Per Tube QAC breakfast  . methylPREDNISolone (SOLU-MEDROL) injection  60 mg Intravenous Q12H  . metoprolol tartrate  25 mg Oral Q6H  . mometasone-formoterol  2 puff Inhalation BID  . pantoprazole (PROTONIX) IV  40 mg Intravenous Daily  . piperacillin-tazobactam (ZOSYN)  IV  3.375 g Intravenous 3 times per day  . pravastatin  80 mg Oral Daily  . sodium chloride  3 mL Intravenous Q12H  . sterile water (preservative free)      . vancomycin  1,000 mg Intravenous Once  . [START ON 01/09/15] vancomycin  750 mg Intravenous Q24H  . vecuronium       Continuous Infusions: . amiodarone    . fentaNYL infusion INTRAVENOUS 50 mcg/hr (01/02/15 1501)  . norepinephrine 32 mcg/min (01/02/15 1526)  . norepinephrine    .  sodium bicarbonate  infusion 1000 mL    . vasopressin (PITRESSIN) infusion - *FOR SHOCK*     PRN Meds:.acetaminophen **OR** acetaminophen, ondansetron **OR**  ondansetron (ZOFRAN) IV Past Medical History  Diagnosis Date  . COPD (chronic obstructive pulmonary disease)   . Hypertension    Past Surgical History  Procedure Laterality Date  . Tonsillectomy and adenoidectomy    . Cholecystectomy    . Abdominal hysterectomy     Social History   Social History  . Marital Status: Widowed    Spouse Name: N/A  . Number of Children: N/A  . Years of Education: N/A   Occupational History  . Not on file.   Social History Main Topics  . Smoking status: Former Games developer  . Smokeless tobacco: Not on file  . Alcohol Use: No  . Drug Use: Not on file  . Sexual Activity: Not on file   Other Topics Concern  . Not on file   Social History Narrative  . No narrative on file   Allergies  Allergen Reactions  . Sulfa Antibiotics Nausea Only    Last set of Vital Signs (not current) Filed Vitals:   01/02/15 1230  BP: 114/59  Pulse: 80  Temp:   Resp: 27      Physical Exam  Gen: unresponsive Cardiovascular: + pulses, hypotensive Resp: dyspneic. Breath sounds equal bilaterally with bagging  Abd: nondistended  Neuro: GCS 3, unresponsive to pain  HEENT: No blood in posterior pharynx, gag reflex absent  Neck: No crepitus  Musculoskeletal: No deformity  Skin: warm  Procedures  INTUBATION Performed by: Jene Every Required items: required blood products, implants, devices, and  special equipment available Patient identity confirmed: provided demographic data and hospital-assigned identification number Time out: Immediately prior to procedure a "time out" was called to verify the correct patient, procedure, equipment, support staff and site/side marked as required. Indications: respiratory distress  Intubation method: oral Preoxygenation: BVM Sedatives: per palliative care MD Paralytic: none Tube Size: 7.0 cuffed Post-procedure assessment: chest rise and ETCO2 monitor Breath sounds: equal and absent over the epigastrium Tube secured by  Respiratory Therapy Patient tolerated the procedure well with no immediate complications.   After intubation I turned over care to the patient's hospitalist.         Jene Every, MD 01/02/15 [phone]

## 2015-01-02 NOTE — Consult Note (Signed)
Palliative Medicine Inpatient Consult Note   Name: Breanna Ford Date: 01/02/2015 MRN: [id number]  DOB: Jul 02, 1927  Referring Physician: Marguarite Arbour, MD  Palliative Care consult requested for this 81 y.o. female for goals of medical therapy in patient with Community Acquired Pneumonia.    TODAYS CONVERSATIONS, INTERVENTIONS, AND PLAN:  1)  Pt was found to be getting into worse and worse respiratory distress with profound and obvious fatigue and tachypnea with respirations up to 40/ min.  Family reported that this patient was very well at baseline and they wanted her to have a chance for recovery and they very appropriately opted to have her placed on the ventilator.  There was not time to try noninvasive ventilation and she needed urgent intubation.  As the critical care doctor was involved in a bronchoscopy procedure and as the patient's attending could not come instantly to the bedside, I volunteered to oversee the emergent intubation of the patient, as this could not wait.  RT attempted intubation x2 w/o success and at that point, we called a code blue --essentially to get the ED physician present for intubation stat (and other team members present if needed--which was not the case).  She was quickly intubated by the ED physician but the airway was edematous.  She never had cardiac arrest and she did not cease respirations,  but her oxygen saturations had dropped into the 70's and emergent intubation was life saving.  She was sedated and initial vent management orders were placed and follow up xrays ordered. She has a difficult airway for intubation.    2)  Family feels that she should have aggressive medical treatment for this pneumonia and they will be hoping for updates daily as to which direction she is going in in terms of recovery-- or not.  They do not want her to be maintained on a vent for a prolonged period of time and they do not feel that she would want a trache or a permanent type  of feeding tube (temporary NG/ OG for feeding would be OK and expected as they would not want her to become malnourished).  The decision makers are her adult son and daughter, but I am told that the son, who lives out of town, will likely defer most decisions to his sister, as she has been the one most involved in the day to day matters with their mother.   3)  Family has agreed with a limited code status which basically permits what is already being done at this point: intubation, ventilation, pressors (she is getting Levophed), and antiarrythmics (amiodarone or others are permitted).  They would not wish for pt to have compressions or cardioversion in the event of cardio-pulmonary arrest, however.   4)  I had just started to broaden patient's antibiotics and adjust her orders when Dr Belia Heman came to the floor and took over the critical care management of this patient who appears to have a very severe, but hopefully treatable pneumonia.  My role of palliative care physician will now be to monitor the progress and to again become involved and available for supportive discussions with the family, should they need this.  There were many tears shed today by family.  More family members are on their way from other cities.       REVIEW OF SYSTEMS:  Patient is not able to provide ROS due to critical illness  SPIRITUAL SUPPORT SYSTEM: Yes Family is very supportive and involved.  SOCIAL HISTORY:  reports  that she has quit smoking. She does not have any smokeless tobacco history on file. She reports that she does not drink alcohol. She has a daughter who lives locally and who is very involved in patient's life.  Pt is independent.  She was doing water aerobics and walking a mile every day until just before she came in to the hospital.  Pt is a remote smoker.  She has been very healthy overall and was not using oxygen or breathing treatments at home.  She did use an inhaler when needed.    LEGAL DOCUMENTS:   None  CODE STATUS: Limited code  As of Now.  She had been full code status, but after talking with family, and just prior to patient being intubated, they agreed that intubation, antiarrhythmics, and pressors (IV Meds) were all OK to use, but they did not want her to have compresions, cardioversions performed in the event of cardiopulmonary arrest.  This is because she is critically ill now and they feel the ventilator and IV meds as mentioned may give her a chance for recovery, and she is not in a 'code situation'.  PAST MEDICAL HISTORY: Past Medical History  Diagnosis Date  . COPD (chronic obstructive pulmonary disease)   . Hypertension     PAST SURGICAL HISTORY:  Past Surgical History  Procedure Laterality Date  . Tonsillectomy and adenoidectomy    . Cholecystectomy    . Abdominal hysterectomy      ALLERGIES:  is allergic to sulfa antibiotics.  MEDICATIONS:  Current Facility-Administered Medications  Medication Dose Route Frequency Provider Last Rate Last Dose  . acetaminophen (TYLENOL) tablet 650 mg  650 mg Oral Q6H PRN Arnaldo Natal, MD   650 mg at January 18, 2015 1542   Or  . acetaminophen (TYLENOL) suppository 650 mg  650 mg Rectal Q6H PRN Arnaldo Natal, MD      . albuterol (PROVENTIL) (2.5 MG/3ML) 0.083% nebulizer solution 2.5 mg  2.5 mg Nebulization Q4H PRN Lauro Regulus, MD      . amiodarone (NEXTERONE PREMIX) 360 MG/200ML (1.8 mg/mL) IV infusion  60 mg/hr Intravenous Continuous Rise Mu, PA-C 33.3 mL/hr at 01/02/15 1121 60 mg/hr at 01/02/15 1121   Followed by  . amiodarone (NEXTERONE PREMIX) 360 MG/200ML (1.8 mg/mL) IV infusion  30 mg/hr Intravenous Continuous Rise Mu, PA-C      . busPIRone (BUSPAR) tablet 15 mg  15 mg Oral BID Arnaldo Natal, MD   15 mg at 01/18/2015 2125  . cefTRIAXone (ROCEPHIN) 1 g in dextrose 5 % 50 mL IVPB  1 g Intravenous Q24H Arnaldo Natal, MD   1 g at 01/02/15 0835  . chlorpheniramine-HYDROcodone (TUSSIONEX) 10-8  MG/5ML suspension 5 mL  5 mL Oral Q12H PRN Marguarite Arbour, MD      . diphenhydrAMINE (BENADRYL) capsule 25 mg  25 mg Oral QPM Arnaldo Natal, MD   25 mg at 01/18/15 1753  . docusate sodium (COLACE) capsule 100 mg  100 mg Oral BID Arnaldo Natal, MD   100 mg at 01/02/15 1050  . doxycycline (VIBRAMYCIN) 100 mg in dextrose 5 % 250 mL IVPB  100 mg Intravenous Q12H Marguarite Arbour, MD      . enoxaparin (LOVENOX) injection 75 mg  1 mg/kg Subcutaneous q morning - 10a Marguarite Arbour, MD   75 mg at 01/02/15 1049  . fluticasone (FLONASE) 50 MCG/ACT nasal spray 2 spray  2 spray Each Nare Daily Arnaldo Natal,  MD   2 spray at 12/22/2014 1139  . guaiFENesin (MUCINEX) 12 hr tablet 600 mg  600 mg Oral BID Marguarite Arbour, MD   600 mg at 01/02/15 1050  . hydrochlorothiazide (HYDRODIURIL) tablet 25 mg  25 mg Oral Daily Arnaldo Natal, MD   25 mg at 01/02/15 1050  . insulin aspart (novoLOG) injection 0-9 Units  0-9 Units Subcutaneous TID WC Marguarite Arbour, MD   0 Units at 01/02/15 1028  . ipratropium-albuterol (DUONEB) 0.5-2.5 (3) MG/3ML nebulizer solution 3 mL  3 mL Nebulization QID Marguarite Arbour, MD   3 mL at 01/02/15 1129  . levothyroxine (SYNTHROID, LEVOTHROID) tablet 100 mcg  100 mcg Oral QAC breakfast Arnaldo Natal, MD   100 mcg at 01/02/15 [phone]  . LORazepam (ATIVAN) tablet 0.5 mg  0.5 mg Oral BID PRN Arnaldo Natal, MD   0.5 mg at 01/02/15 1102  . methylPREDNISolone sodium succinate (SOLU-MEDROL) 125 mg/2 mL injection 60 mg  60 mg Intravenous Q12H Marguarite Arbour, MD   60 mg at 01/02/15 0834  . metoprolol tartrate (LOPRESSOR) tablet 25 mg  25 mg Oral Q6H Marguarite Arbour, MD   25 mg at 01/02/15 [phone]  . mometasone-formoterol (DULERA) 100-5 MCG/ACT inhaler 2 puff  2 puff Inhalation BID Arnaldo Natal, MD   2 puff at 12/24/2014 2125  . morphine 2 MG/ML injection 1 mg  1 mg Intravenous Q4H PRN Arnaldo Natal, MD   1 mg at 01/02/15 0244  . ondansetron (ZOFRAN) tablet 4 mg  4 mg Oral  Q6H PRN Arnaldo Natal, MD       Or  . ondansetron [facility]) injection 4 mg  4 mg Intravenous Q6H PRN Arnaldo Natal, MD      . pravastatin (PRAVACHOL) tablet 80 mg  80 mg Oral Daily Arnaldo Natal, MD   80 mg at 01/02/15 1050  . sodium chloride 0.9 % injection 3 mL  3 mL Intravenous Q12H Arnaldo Natal, MD   3 mL at 01/02/15 1000    Vital Signs: BP 114/59 mmHg  Pulse 80  Temp(Src) 97.9 F (36.6 C) (Oral)  Resp 27  Ht 5\' 5"  (1.651 m)  Wt 72.6 kg (160 lb 0.9 oz)  BMI 26.63 kg/m2  SpO2 87% Filed Weights   01/07/2015 0324 01/02/15 0622  Weight: 77.111 kg (170 lb) 72.6 kg (160 lb 0.9 oz)    Estimated body mass index is 26.63 kg/(m^2) as calculated from the following:   Height as of this encounter: 5\' 5"  (1.651 m).   Weight as of this encounter: 72.6 kg (160 lb 0.9 oz).  PERFORMANCE STATUS (ECOG) : 4 - Bedbound currently --BUT just prior to this illness she lived independently, walked a mile every day, and was going to water aerobics daily.    PHYSICAL EXAM: She was very fatigued and getting into worsening respiratory distress prior to sedation and intubation She could speak only very weakly EOMI --she did focus Neck w/o JVD  Heart rrr no mgr Lungs filled with ronchi and rales --especially on the left Abd soft and nontender Ext becoming cyanotic distally  LABS: CBC:    Component Value Date/Time   WBC 5.7 01/02/2015 0748   WBC 7.4 01/04/2014 1244   HGB 15.4 01/02/2015 0748   HGB 14.6 01/04/2014 1244   HCT 47.1* 01/02/2015 0748   HCT 44.5 01/04/2014 1244   PLT 94* 01/02/2015 0748   PLT 116* 01/04/2014 1244   MCV  100.4* 01/02/2015 0748   MCV 100 01/04/2014 1244   NEUTROABS 3.7 01/02/2015 0748   NEUTROABS 2.8 01/10/2013 0512   LYMPHSABS 1.2 01/02/2015 0748   LYMPHSABS 3.2 01/10/2013 0512   MONOABS 0.8 01/02/2015 0748   MONOABS 0.7 01/10/2013 0512   EOSABS 0.0 01/02/2015 0748   EOSABS 0.4 01/10/2013 0512   BASOSABS 0.0 01/02/2015 0748   BASOSABS 0.0  01/10/2013 0512   Comprehensive Metabolic Panel:    Component Value Date/Time   NA 133* 01/02/2015 0748   NA 141 01/04/2014 1244   K 4.5 01/02/2015 0748   K 4.4 01/04/2014 1244   CL 102 01/02/2015 0748   CL 109* 01/04/2014 1244   CO2 18* 01/02/2015 0748   CO2 23 01/04/2014 1244   BUN 23* 01/02/2015 0748   BUN 19* 01/04/2014 1244   CREATININE 1.47* 01/02/2015 0748   CREATININE 1.18 01/04/2014 1244   GLUCOSE 131* 01/02/2015 0748   GLUCOSE 90 01/04/2014 1244   CALCIUM 8.4* 01/02/2015 0748   CALCIUM 9.2 01/04/2014 1244   AST 47* 01/02/2015 0748   AST 39* 01/04/2014 1244   ALT 18 01/02/2015 0748   ALT 26 01/04/2014 1244   ALKPHOS 16* 01/02/2015 0748   ALKPHOS 69 01/04/2014 1244   BILITOT 0.5 01/02/2015 0748   BILITOT 0.6 01/04/2014 1244   PROT 6.1* 01/02/2015 0748   PROT 7.5 01/04/2014 1244   ALBUMIN 3.2* 01/02/2015 0748   ALBUMIN 3.9 01/04/2014 1244    IMPRESSION: Acute Respiratory Failure   Due to  Community Acquired Pneumonia Mild chronic COPD Metabolic Encephalopathy Difficult airway/ edema of airway   See top of note for  PLAN   REFERRALS TO BE ORDERED:  None as yet  More than 50% of the visit was spent in counseling/coordination of care: Yes  Time Spent: 70 minutes in intitial consultation role as Palliative Care Consultant And then another 60 minutes in critical care management as detailed above.

## 2015-01-02 NOTE — Progress Notes (Signed)
MD notified of pt SOB and decreasing O2 sats (80's on 6L [place]), HR fluctuating to 130s -140s and then back down to 80s'-100's. Orders received for PRN albuterol tx. Respiratory therapists suggests the pt needs something to remove excess fluid. MD notified. IV lasix  x 1 ordered and iv fluids d/c'd. Veni mask placed for decreasing O2 sats per respiratory therapy. O2 sats currently 94%. Will continue to monitor. VSS.

## 2015-01-02 NOTE — Procedures (Signed)
Central Venous Catheter Placement: Indication: Patient receiving vesicant or irritant drug.; Patient receiving intravenous therapy for longer than 5 days.; Patient has limited or no vascular access.   Consent:verabl/written  Risks and benefits explained in detail including risk of infection, bleeding, respiratory failure and death..   Hand washing performed prior to starting the procedure.   Procedure: An active timeout was performed and correct patient, name, & ID confirmed.  After explaining risk and benefits, patient was positioned correctly for central venous access. Patient was prepped using strict sterile technique including chlorohexadine preps, sterile drape, sterile gown and sterile gloves.  The area was prepped, draped and anesthetized in the usual sterile manner. Patient comfort was obtained.  A triple lumen catheter was placed in RIGHT Internal Jugular Vein There was good blood return, catheter caps were placed on lumens, catheter flushed easily, the line was secured and a sterile dressing and BIO-PATCH applied.   Ultrasound was used to visualize vasculature and guidance of needle.   Number of Attempts: 1 Complications:none Estimated Blood Loss: none Chest Radiograph indicated and ordered.  Procedure time:10 mins Operator: Antavius Sperbeck.   Lucie Leather, M.D.  [facility]  Medical Director [facility] Medical Director [facility] Cardio-Pulmonary Department

## 2015-01-02 NOTE — Progress Notes (Signed)
PT Cancellation Note  Patient Details Name: CARMELINA BALDUCCI MRN: [id number] DOB: 09-03-1927   Cancelled Treatment:    Reason Eval/Treat Not Completed: Medical issues which prohibited therapy (Patient with continued decline in medical condition; now intubated/sedated.  Will complete initial PT order; please re-consult as medically appropriate.)  Georgean Spainhower H. Manson Passey, PT, DPT, NCS 01/02/2015, 3:07 PM [phone]

## 2015-01-02 NOTE — Progress Notes (Signed)
Intubation attempted x 2,unable to pass et tube through cords,ER MD successfully intubated ,Et tube secured,placed on ventilator

## 2015-01-02 NOTE — Progress Notes (Signed)
PT Cancellation Note  Patient Details Name: Breanna Ford MRN: [id number] DOB: [birth year]   Cancelled Treatment:    Reason Eval/Treat Not Completed: Medical issues which prohibited therapy (PT will hold on pt secondary to newly placed amiodarone drip and transfer to ICU.  PT will continue to monitor and visit pt when it's medically appropriate. )   Angie Fava 01/02/2015, 11:11 AM

## 2015-01-03 ENCOUNTER — Inpatient Hospital Stay: Payer: Medicare PPO

## 2015-01-03 LAB — CBC WITH DIFFERENTIAL/PLATELET
BAND NEUTROPHILS: 13 % — AB (ref 0–10)
BASOS PCT: 0 % (ref 0–1)
Basophils Absolute: 0 10*3/uL (ref 0.0–0.1)
Blasts: 0 %
EOS ABS: 0 10*3/uL (ref 0.0–0.7)
Eosinophils Relative: 0 % (ref 0–5)
HCT: 38 % (ref 35.0–47.0)
Hemoglobin: 12.7 g/dL (ref 12.0–16.0)
LYMPHS ABS: 1.8 10*3/uL (ref 0.7–4.0)
LYMPHS PCT: 42 % (ref 12–46)
MCH: 33 pg (ref 26.0–34.0)
MCHC: 33.4 g/dL (ref 32.0–36.0)
MCV: 98.8 fL (ref 80.0–100.0)
MONO ABS: 0.3 10*3/uL (ref 0.1–1.0)
MONOS PCT: 8 % (ref 3–12)
Metamyelocytes Relative: 0 %
Myelocytes: 0 %
NEUTROS ABS: 2.1 10*3/uL (ref 1.7–7.7)
Neutrophils Relative %: 37 % — ABNORMAL LOW (ref 43–77)
OTHER: 0 %
PLATELETS: 71 10*3/uL — AB (ref 150–440)
Promyelocytes Absolute: 0 %
RBC: 3.85 MIL/uL (ref 3.80–5.20)
RDW: 13.7 % (ref 11.5–14.5)
WBC: 4.2 10*3/uL (ref 3.6–11.0)
nRBC: 1 /100 WBC — ABNORMAL HIGH

## 2015-01-03 LAB — BLOOD GAS, ARTERIAL
ACID-BASE DEFICIT: 15.3 mmol/L — AB (ref 0.0–2.0)
Allens test (pass/fail): POSITIVE — AB
Bicarbonate: 14.8 mEq/L — ABNORMAL LOW (ref 21.0–28.0)
FIO2: 100
MECHVT: 450 mL
Mechanical Rate: 24
O2 SAT: 98.3 %
PATIENT TEMPERATURE: 37
PCO2 ART: 51 mmHg — AB (ref 32.0–48.0)
PEEP/CPAP: 12 cmH2O
PH ART: 7.07 — AB (ref 7.350–7.450)
RATE: 24 resp/min
pO2, Arterial: 146 mmHg — ABNORMAL HIGH (ref 83.0–108.0)

## 2015-01-03 LAB — COMPREHENSIVE METABOLIC PANEL
ALBUMIN: 2.1 g/dL — AB (ref 3.5–5.0)
ALK PHOS: 9 U/L — AB (ref 38–126)
ALT: 145 U/L — ABNORMAL HIGH (ref 14–54)
ANION GAP: 16 — AB (ref 5–15)
AST: 242 U/L — AB (ref 15–41)
BUN: 37 mg/dL — AB (ref 6–20)
CO2: 21 mmol/L — AB (ref 22–32)
Calcium: 6.9 mg/dL — ABNORMAL LOW (ref 8.9–10.3)
Chloride: 91 mmol/L — ABNORMAL LOW (ref 101–111)
Creatinine, Ser: 2.89 mg/dL — ABNORMAL HIGH (ref 0.44–1.00)
GFR calc Af Amer: 16 mL/min — ABNORMAL LOW (ref >60)
GFR calc non Af Amer: 14 mL/min — ABNORMAL LOW (ref >60)
GLUCOSE: 221 mg/dL — AB (ref 65–99)
POTASSIUM: 5 mmol/L (ref 3.5–5.1)
SODIUM: 128 mmol/L — AB (ref 135–145)
Total Bilirubin: 0.6 mg/dL (ref 0.3–1.2)
Total Protein: 4.4 g/dL — ABNORMAL LOW (ref 6.5–8.1)

## 2015-01-03 LAB — GLUCOSE, CAPILLARY
Glucose-Capillary: 162 mg/dL — ABNORMAL HIGH (ref 65–99)
Glucose-Capillary: 203 mg/dL — ABNORMAL HIGH (ref 65–99)
Glucose-Capillary: 215 mg/dL — ABNORMAL HIGH (ref 65–99)

## 2015-01-03 LAB — TROPONIN I: Troponin I: 0.42 ng/mL — ABNORMAL HIGH (ref <0.031)

## 2015-01-03 LAB — MAGNESIUM: MAGNESIUM: 1.7 mg/dL (ref 1.7–2.4)

## 2015-01-03 MED ORDER — EPINEPHRINE HCL 1 MG/ML IJ SOLN
1.0000 mg | Freq: Once | INTRAMUSCULAR | Status: AC
  Administered 2015-01-03: 1 mg via INTRAVENOUS

## 2015-01-06 LAB — CULTURE, BLOOD (ROUTINE X 2)
Culture: NO GROWTH
Culture: NO GROWTH

## 2015-01-16 NOTE — Progress Notes (Signed)
Pt became bradycardic and hypotensive, monitor showing a very slow atrial fibrillation. Pt is a limited code, family conference reaffirmed this wish. Pt's levophed increased, given 1 amp of epi and pts heart rate increased to 110's, BP with MAPs above 65. Pastoral care to bedside to pray with family. Family now at bedside.

## 2015-01-16 NOTE — Consult Note (Signed)
Called to bedside to evaluate acute bradycardia/asystole. Patient given EPI and Atropine, but HR continued to decline. Family witnessed this, patient pronounced dead at 905 AM.

## 2015-01-16 NOTE — Progress Notes (Signed)
SUBJECTIVE: Pt remains intubated, unresponsive with family at bedside. HR in 50s.    Filed Vitals:   02/01/15 0500 02-01-2015 0600 February 01, 2015 0754 02-01-15 0800  BP: 108/45 114/56  98/45  Pulse:      Temp:      TempSrc:      Resp: Height:      Weight: 74.2 kg (163 lb 9.3 oz)     SpO2:   98%     Intake/Output Summary (Last 24 hours) at 02/01/15 0906 Last data filed at 01/02/15 1900  Gross per 24 hour  Intake 1898.6 ml  Output    875 ml  Net 1023.6 ml    LABS: Basic Metabolic Panel:  Recent Labs  96/04/54 0748 2015-02-01 0420 02-01-15 0427  NA 133* 128*  --   K 4.5 5.0  --   CL 102 91*  --   CO2 18* 21*  --   GLUCOSE 131* 221*  --   BUN 23* 37*  --   CREATININE 1.47* 2.89*  --   CALCIUM 8.4* 6.9*  --   MG  --   --  1.7   Liver Function Tests:  Recent Labs  01/02/15 0748 02/01/15 0420  AST 47* 242*  ALT 18 145*  ALKPHOS 16* 9*  BILITOT 0.5 0.6  PROT 6.1* 4.4*  ALBUMIN 3.2* 2.1*   No results for input(s): LIPASE, AMYLASE in the last 72 hours. CBC:  Recent Labs  01/02/15 0748 01-Feb-2015 0420  WBC 5.7 4.2  NEUTROABS 3.7 2.1  HGB 15.4 12.7  HCT 47.1* 38.0  MCV 100.4* 98.8  PLT 94* 71*   Cardiac Enzymes:  Recent Labs  01/08/2015 0428 01/02/15 1658 2015/02/01 0420  TROPONINI 0.03 0.35* 0.42*   BNP: Invalid input(s): POCBNP D-Dimer: No results for input(s): DDIMER in the last 72 hours. Hemoglobin A1C:  Recent Labs  12/19/2014 0721  HGBA1C 5.3   Fasting Lipid Panel: No results for input(s): CHOL, HDL, LDLCALC, TRIG, CHOLHDL, LDLDIRECT in the last 72 hours. Thyroid Function Tests:  Recent Labs  01/06/2015 0428  TSH 1.623   Anemia Panel: No results for input(s): VITAMINB12, FOLATE, FERRITIN, TIBC, IRON, RETICCTPCT in the last 72 hours.   PHYSICAL EXAM General: Well developed, well nourished, in no acute distress HEENT:  Normocephalic and atramatic Neck:  No JVD.  Lungs: Clear bilaterally to auscultation and percussion. Heart:  HRRR . Normal S1 and S2 without gallops or murmurs.  Abdomen: Bowel sounds are positive, abdomen soft and non-tender  Msk:  Back normal, normal gait. Normal strength and tone for age. Extremities: No clubbing, cyanosis or edema.   Neuro: Alert and oriented X 3. Psych:  Good affect, responds appropriately  TELEMETRY: a-fib with slow ventricular response rate  ASSESSMENT AND PLAN: Respiratory failure 2/2 PNA and possible septic shock. Pt on 40 mcg of levophed and multiple abx. amio gtt stopped 2/2 HR in 30s last night. Echo shows borderline LVEF, 55%. Mildly elevated troponin probably 2/2 demand ischemia and not MI. Advise continuation of supportive care. Prognosis appears to be poor due to severe PNA.   Active Problems:   CAP (community acquired pneumonia)   SIRS (systemic inflammatory response syndrome)   Septic shock   COPD exacerbation   Acute respiratory failure    KHAN,SHAUKAT A, MD, [facility] 02-01-15 9:06 AM

## 2015-01-16 NOTE — Progress Notes (Signed)
Took patient off ventilator and bagged patient with 100% oxygen per Dr. Belia Heman.  Dr. Belia Heman pronounced patient around [phone] and bagging was discontinued.

## 2015-01-16 NOTE — Consult Note (Signed)
[facility] CONSULT NOTE    Date: 01/31/15                  Patient Name:  Breanna Ford  MRN: [id number]  DOB: [birth year]  Age / Sex: 81 y.o., female         PCP: Idelle Crouch, MD                 Service Requesting Consult: Dr. Doy Hutching                 Reason for Consult: Acute renal failure            History of Present Illness: Patient is a 81 y.o. female with a PMHx of COPD and hypertension, who was admitted to [facility] on 12/22/2014 for evaluation of worsening shortness of breath. She is currently unable to offer any history as she is currently intubated.  She is critically ill at this point in time and is maintained on the ventilator. She originally presented with shortness of breath and was found to have left upper lobe pneumonia as well as atrial fibrillation with rapid ventricular response. She's had decline in status and periods of severe bradycardia overnight. We are asked to see him for evaluation management of acute renal failure. She has known underlying chronic kidney disease stage III with baseline EGFR in the 45s. Creatinine is currently up to 2.89. She is maintained on pressors at present. Serum sodium is also dropping and is down to 128. Family is at bedside. She currently has partial CODE STATUS.   Medications: Outpatient medications: Prescriptions prior to admission  Medication Sig Dispense Refill Last Dose  . ADVAIR DISKUS 250-50 MCG/DOSE AEPB Inhale 1 puff into the lungs 2 (two) times daily.   12/31/2014 at Unknown time  . albuterol (PROVENTIL HFA;VENTOLIN HFA) 108 (90 BASE) MCG/ACT inhaler Inhale 2 puffs into the lungs every 4 (four) hours as needed for wheezing or shortness of breath.   12/31/2014 at unknown  . amLODipine (NORVASC) 5 MG tablet Take 1 tablet by mouth daily.   12/31/2014 at Unknown time  . busPIRone (BUSPAR) 15 MG tablet Take 15 mg by mouth 2 (two) times daily.   12/31/2014 at unknown  . diphenhydrAMINE (BENADRYL) 25 mg capsule  Take 25 mg by mouth every evening.   12/31/2014 at Unknown time  . fluticasone (FLONASE) 50 MCG/ACT nasal spray Place 2 sprays into both nostrils daily.   12/31/2014 at Unknown time  . hydrochlorothiazide (HYDRODIURIL) 25 MG tablet Take 25 mg by mouth daily.   12/31/2014 at Unknown time  . levothyroxine (SYNTHROID, LEVOTHROID) 100 MCG tablet Take 1 tablet by mouth daily.   12/31/2014 at Unknown time  . LORazepam (ATIVAN) 0.5 MG tablet Take 1 tablet by mouth 2 (two) times daily as needed for anxiety.    12/31/2014 at Unknown time  . losartan (COZAAR) 100 MG tablet Take 100 mg by mouth daily.   12/31/2014 at Unknown time  . metoprolol succinate (TOPROL-XL) 25 MG 24 hr tablet Take 1 tablet by mouth daily.   12/31/2014 at pm  . pravastatin (PRAVACHOL) 80 MG tablet Take 1 tablet by mouth daily.   12/31/2014 at Unknown time    Current medications: Current Facility-Administered Medications  Medication Dose Route Frequency Provider Last Rate Last Dose  . acetaminophen (TYLENOL) tablet 650 mg  650 mg Per Tube Q6H PRN Colleen Can, MD       Or  . acetaminophen (TYLENOL) suppository 650 mg  650 mg Rectal Q6H PRN Colleen Can, MD      . amiodarone (NEXTERONE PREMIX) 360 MG/200ML (1.8 mg/mL) IV infusion  30 mg/hr Intravenous Continuous Barnabas Harries, PA-C 16.7 mL/hr at 01/02/15 2236 30 mg/hr at 01/02/15 2236  . antiseptic oral rinse solution (CORINZ)  7 mL Mouth Rinse QID Colleen Can, MD   7 mL at 2015-01-08 0402  . budesonide (PULMICORT) nebulizer solution 0.5 mg  0.5 mg Nebulization BID Flora Lipps, MD   0.5 mg at 01/02/15 1946  . chlorhexidine (PERIDEX) 0.12 % solution 15 mL  15 mL Mouth/Throat BID Flora Lipps, MD   15 mL at 01/02/15 2219  . fentaNYL 2574mg in NS 2578m(1018mml) infusion-PREMIX  10 mcg/hr Intravenous Continuous KurFlora LippsD 5 mL/hr at 01/02/15 1501 50 mcg/hr at 01/02/15 1501  . heparin ADULT infusion 100 units/mL (25000 units/250 mL)  1,000 Units/hr Intravenous  Continuous MicCharlett NosePH 10 mL/hr at 12/23/22/201603 1,000 Units/hr at 12/2014-12-2401  . insulin aspart (novoLOG) injection 0-9 Units  0-9 Units Subcutaneous Q6H MarColleen CanD   3 Units at 12/23/22/165534-723-2258 ipratropium-albuterol (DUONEB) 0.5-2.5 (3) MG/3ML nebulizer solution 3 mL  3 mL Nebulization Q4H KurFlora LippsD   3 mL at 08/Aug 24, 201606  . levothyroxine (SYNTHROID, LEVOTHROID) tablet 100 mcg  100 mcg Per Tube QAC breakfast MarColleen CanD      . methylPREDNISolone sodium succinate (SOLU-MEDROL) 125 mg/2 mL injection 60 mg  60 mg Intravenous Q12H JefIdelle CrouchD   60 mg at 01/02/15 2050  . mometasone-formoterol (DULERA) 100-5 MCG/ACT inhaler 2 puff  2 puff Inhalation BID MicHarrie ForemanD   2 puff at 01/10/2015 2125  . norepinephrine (LEVOPHED) 16 mg in dextrose 5 % 250 mL (0.064 mg/mL) infusion  0-40 mcg/min Intravenous Titrated KurFlora LippsD 23.4 mL/hr at 01/02/15 1900 25 mcg/min at 01/02/15 1900  . ondansetron (ZOFRAN) tablet 4 mg  4 mg Oral Q6H PRN MicHarrie ForemanD       Or  . ondansetron (Z[facility]apt[facility] Louisvill[facility]jection 4 mg  4 mg Intravenous Q6H PRN MicHarrie ForemanD      . pantoprazole (PROTONIX) injection 40 mg  40 mg Intravenous Daily MarColleen CanD   40 mg at 01/02/15 1856  . piperacillin-tazobactam (ZOSYN) IVPB 3.375 g  3.375 g Intravenous 3 times per day MicCharlett NosePH   3.375 g at 12/2014/12/2452  . sodium bicarbonate 150 mEq in dextrose 5 % 1,000 mL infusion   Intravenous Continuous JefIdelle CrouchD 150 mL/hr at 12/23/22/1628    . sodium chloride 0.9 % injection 3 mL  3 mL Intravenous Q12H MicHarrie ForemanD   3 mL at 01/02/15 2220  . vancomycin (VANCOCIN) IVPB 750 mg/150 ml premix  750 mg Intravenous Q24H MicCharlett NosePH   750 mg at 08/08-24-1650  . vasopressin (PITRESSIN) 40 Units in sodium chloride 0.9 % 250 mL (0.16 Units/mL) infusion  0.03 Units/min Intravenous Continuous KurFlora LippsD 11.3 mL/hr at 01/02/15 1613 0.03  Units/min at 01/02/15 1613      Allergies: Allergies  Allergen Reactions  . Sulfa Antibiotics Nausea Only      Past Medical History: Past Medical History  Diagnosis Date  . COPD (chronic obstructive pulmonary disease)   . Hypertension      Past Surgical History: Past Surgical History  Procedure Laterality Date  . Tonsillectomy and adenoidectomy    .  Cholecystectomy    . Abdominal hysterectomy       Family History: Family History  Problem Relation Age of Onset  .        Social History: Social History   Social History  . Marital Status: Widowed    Spouse Name: N/A  . Number of Children: N/A  . Years of Education: N/A   Occupational History  . Not on file.   Social History Main Topics  . Smoking status: Former Research scientist (life sciences)  . Smokeless tobacco: Not on file  . Alcohol Use: No  . Drug Use: Not on file  . Sexual Activity: Not on file   Other Topics Concern  . Not on file   Social History Narrative  . No narrative on file     Review of Systems: As per HPI  Vital Signs: Blood pressure 114/56, pulse 48, temperature 98.3 F (36.8 C), temperature source Oral, resp. rate 25, height _0  (1.651 m), weight 74.2 kg (163 lb 9.3 oz), SpO2 97 %.  Weight trends: Filed Weights   12/17/2014 0324 01/02/15 0622 01-05-2015 0500  Weight: 77.111 kg (170 lb) 72.6 kg (160 lb 0.9 oz) 74.2 kg (163 lb 9.3 oz)    Physical Exam: General: Critically ill appearing  Head: Normocephalic, atraumatic.  Eyes: Having some spontaneous EOM, pupils 65m and sluggish  Nose: Mucous membranes moist, not inflammed, nonerythematous.  Throat: Endotracheal tube noted to be in place.   Neck: supple.  Lungs:  Bilateral rhonchi and rales, vent assisted.  Heart: S1S2 irregular  Abdomen:  Soft NTND sluggish BS  Extremities: Trace LE edema  Neurologic: On the vent, not following commands  Skin: No visible rashes    Lab results: Basic Metabolic Panel:  Recent Labs Lab 12/27/2014 0428  01/02/15 0748 [id number]/21/[id number]  NA 136 133* 128*  --   K 3.8 4.5 5.0  --   CL 104 102 91*  --   CO2 22 18* 21*  --   GLUCOSE 180* 131* 221*  --   BUN 16 23* 37*  --   CREATININE 1.44* 1.47* 2.89*  --   CALCIUM 9.1 8.4* 6.9*  --   MG  --   --   --  1.7    Liver Function Tests:  Recent Labs Lab 01/14/2015 0428 01/02/15 0748 02016/08/[id number]  AST 50* 47* 242*  ALT 25 18 145*  ALKPHOS 47 16* 9*  BILITOT 1.0 0.5 0.6  PROT 6.8 6.1* 4.4*  ALBUMIN 4.0 3.2* 2.1*   No results for input(s): LIPASE, AMYLASE in the last 168 hours. No results for input(s): AMMONIA in the last 168 hours.  CBC:  Recent Labs Lab 01/05/2015 0428 01/02/15 0748 [id number]  WBC 8.7 5.7 4.2  NEUTROABS 7.6 3.7 2.1  HGB 14.8 15.4 12.7  HCT 44.1 47.1* 38.0  MCV 98.2 100.4* 98.8  PLT 105* 94* 71*    Cardiac Enzymes:  Recent Labs Lab 01/02/2015 0428 01/02/15 1658 02016/08/[id number]  TROPONINI 0.03 0.35* 0.42*    BNP: Invalid input(s): POCBNP  CBG:  Recent Labs Lab 01/02/15 1618 01/02/15 1818 01/02/15 2147 [id number]  GLUCAP 152* 170* 192* 215* 162*    Microbiology: Results for orders placed or performed during the hospital encounter of 12/17/2014  Culture, blood (routine x 2)     Status: None (Preliminary result)   Collection Time: 12/25/2014  5:34 AM  Result Value Ref Range Status   Specimen Description BLOOD RIGHT ANTECUBITAL  Final  Special Requests BOTTLES DRAWN AEROBIC AND ANAEROBIC 5ML  Final   Culture NO GROWTH 1 DAY  Final   Report Status PENDING  Incomplete  Culture, blood (routine x 2)     Status: None (Preliminary result)   Collection Time: 12/21/2014  5:35 AM  Result Value Ref Range Status   Specimen Description BLOOD BLOOD LEFT FOREARM  Final   Special Requests BOTTLES DRAWN AEROBIC AND ANAEROBIC 5ML  Final   Culture NO GROWTH 1 DAY  Final   Report Status PENDING  Incomplete  Culture, expectorated sputum-assessment     Status: None   Collection  Time: 01/14/2015 11:49 AM  Result Value Ref Range Status   Specimen Description SPUTUM  Final   Special Requests NONE  Final   Sputum evaluation   Final    Sputum specimen not acceptable for testing.  Please recollect.     Report Status 12/25/2014 FINAL  Final  MRSA PCR Screening     Status: None   Collection Time: 01/02/15 11:35 AM  Result Value Ref Range Status   MRSA by PCR NEGATIVE NEGATIVE Final    Comment:        The GeneXpert MRSA Assay (FDA approved for NASAL specimens only), is one component of a comprehensive MRSA colonization surveillance program. It is not intended to diagnose MRSA infection nor to guide or monitor treatment for MRSA infections.     Coagulation Studies: No results for input(s): LABPROT, INR in the last 72 hours.  Urinalysis:  Recent Labs  12/18/2014 1753  COLORURINE YELLOW*  LABSPEC 1.015  PHURINE 6.0  GLUCOSEU NEGATIVE  HGBUR 1+*  BILIRUBINUR NEGATIVE  KETONESUR NEGATIVE  PROTEINUR NEGATIVE  NITRITE POSITIVE*  LEUKOCYTESUR NEGATIVE      Imaging: Dg Chest 2 View  01/02/2015   CLINICAL DATA:  COPD.  EXAM: CHEST  2 VIEW  COMPARISON:  12/24/2014 .  FINDINGS: Mediastinum and hilar structures are normal. Progressive left upper lobe infiltrate and atelectasis. Interim appearance of diffuse left lower lobe infiltrate is present. Left pleural effusion. Right lung is clear. No pneumothorax. Heart size normal. Prior cervical spine fusion.  IMPRESSION: 1. Progressive left upper lobe infiltrate and atelectasis. Interim appearance of diffuse left lower lobe infiltrate.  2.  Small left pleural effusion.   Electronically Signed   By: Marcello Moores  Register   On: 01/02/2015 07:47   Dg Chest Port 1 View  01/02/2015   CLINICAL DATA:  Hypoxia  EXAM: PORTABLE CHEST - 1 VIEW  COMPARISON:  Study obtained earlier in the day  FINDINGS: Endotracheal tube tip is 7.0 cm above the carina. Central catheter tip is in the superior cava. Nasogastric tube tip and side port below  the diaphragm. No pneumothorax. Airspace consolidation is noted in the left upper lobe and left base. There is interstitial edema throughout the remainder of the left lung. The right lung is clear. Lungs are hyperexpanded. Heart size and pulmonary vascularity are within normal limits. There is no demonstrable adenopathy. No bone lesions.  IMPRESSION: Tube and catheter positions as described without pneumothorax. Extensive pneumonia on the left with both interstitial and alveolar consolidation. Right lung clear. Lungs hyperexpanded. Cardiac silhouette within normal limits.   Electronically Signed   By: Lowella Grip III M.D.   On: 01/02/2015 15:41   Dg Chest Port 1 View  01/02/2015   CLINICAL DATA:  Central line placement  EXAM: PORTABLE CHEST - 1 VIEW  COMPARISON:  01/11/2015  FINDINGS: Cardiomediastinal silhouette is stable. There is right IJ central  line with tip in SVC right atrium junction. No pneumothorax. Persistent left lung airspace is and left upper lobe consolidation.  IMPRESSION: There is right IJ central line with tip in SVC right atrium junction. No pneumothorax. Persistent left lung airspace is and left upper lobe consolidation.   Electronically Signed   By: Lahoma Crocker M.D.   On: 01/02/2015 10:21   Dg Abd Portable 1v  01/02/2015   CLINICAL DATA:  NG tube placement  EXAM: PORTABLE ABDOMEN - 1 VIEW  COMPARISON:  None.  FINDINGS: Enteric tube terminates in the proximal gastric body.  Moderate gastric distention.  Mild abdomen gas pattern.  Mild degenerative changes of the lower lumbar spine.  IMPRESSION: Enteric tube terminates in the proximal gastric body.   Electronically Signed   By: Julian Hy M.D.   On: 01/02/2015 15:42      Assessment & Plan: Pt is a 81 y.o. yo female with a PMHX of COPD and hypertension, was admitted to [facility] on 12/30/2014 with increasing shortness of breath. Patient found to have acute respiratory failure, left-sided pneumonia, septic shock, atrial  fibrillation with rapid ventricular response. We were consulted for evaluation management of acute renal failure.  1. Acute renal failure secondary to acute tubular necrosis/CKD stage III. 2. Acute respiratory failure. 3. Acute bacterial pneumonia. 4. Hyponatremia. 5. Septic shock.  Plan: The patient has severe illness at this point in time. She has evidence for multiorgan system failure. We were asked to see her for evaluation management of acute renal failure. Creatinine is rising and is up to 2.89. However she has not yet reached oliguric status. I had a long discussion with the family regarding her current status. We talked about the options for treatment of renal failure in this setting. Family is currently unsure as to how they would proceed but the patient's daughter seems to be favoring avoidance of dialysis. This is certainly reasonable given the patient's current status and overall poor prognosis. For now continue supportive care with pressors, mechanical ventilation, IV antibody therapy. We would certainly avoid any further nephrotoxins.  Thanks for consult.

## 2015-01-16 NOTE — Progress Notes (Signed)
Breanna Ford is a 81 y.o. female  <principal problem not specified>   SUBJECTIVE:  Pt critically ill with respiratory failure now intubated on pressors. Echo OK. Remains in A-fib with varying rates. Has been seen by Pulmonology and Cardiology. Renal fxn worse this AM. Appears septic with lactic acidosis, on bicarb drip.  ______________________________________________________________________  ROS: Review of systems is unremarkable for any active cardiac,respiratory, GI, GU, hematologic, neurologic or psychiatric systems, 10 systems reviewed.  @  Past Medical History  Diagnosis Date  . COPD (chronic obstructive pulmonary disease)   . Hypertension     Past Surgical History  Procedure Laterality Date  . Tonsillectomy and adenoidectomy    . Cholecystectomy    . Abdominal hysterectomy      PHYSICAL EXAM:  BP 114/56 mmHg  Pulse 48  Temp(Src) 98.3 F (36.8 C) (Oral)  Resp 25  Ht  (1.651 m)  Wt 74.2 kg (163 lb 9.3 oz)  BMI 27.22 kg/m2  SpO2 97%  Wt Readings from Last 3 Encounters:  01/12/2015 74.2 kg (163 lb 9.3 oz)            Constitutional: critically ill Neck: supple, no thyromegaly Respiratory: diffuse rhonchi Cardiovascular: irregularly irregular, no murmur, no gallop Abdomen: soft, good BS, nontender Extremities: trace edema Neuro: alert and oriented, no focal motor or sensory deficits  ASSESSMENT/PLAN:  Labs and imaging studies were reviewed  Will ask Nephrology to consult. Repeat CXR this AM. Continue supportive care. Daily labs.

## 2015-01-16 NOTE — Consult Note (Signed)
PULMONARY / CRITICAL CARE MEDICINE   Name: Breanna Ford MRN: [id number] DOB: [birth year]    ADMISSION DATE:  01/11/2015    CHIEF COMPLAINT:   Acute resp failure   Patient    SIGNIFICANT EVENTS  Patient with multiorgan failure, with severe bradycardia, high chance for death today Family at bedside updated Intubated 8/18   PAST MEDICAL HISTORY    :  Past Medical History  Diagnosis Date  . COPD (chronic obstructive pulmonary disease)   . Hypertension    Past Surgical History  Procedure Laterality Date  . Tonsillectomy and adenoidectomy    . Cholecystectomy    . Abdominal hysterectomy     Prior to Admission medications   Medication Sig Start Date End Date Taking? Authorizing Provider  ADVAIR DISKUS 250-50 MCG/DOSE AEPB Inhale 1 puff into the lungs 2 (two) times daily.   Yes Historical Provider, MD  albuterol (PROVENTIL HFA;VENTOLIN HFA) 108 (90 BASE) MCG/ACT inhaler Inhale 2 puffs into the lungs every 4 (four) hours as needed for wheezing or shortness of breath.   Yes Historical Provider, MD  amLODipine (NORVASC) 5 MG tablet Take 1 tablet by mouth daily.   Yes Historical Provider, MD  busPIRone (BUSPAR) 15 MG tablet Take 15 mg by mouth 2 (two) times daily.   Yes Historical Provider, MD  diphenhydrAMINE (BENADRYL) 25 mg capsule Take 25 mg by mouth every evening.   Yes Historical Provider, MD  fluticasone (FLONASE) 50 MCG/ACT nasal spray Place 2 sprays into both nostrils daily.   Yes Historical Provider, MD  hydrochlorothiazide (HYDRODIURIL) 25 MG tablet Take 25 mg by mouth daily.   Yes Historical Provider, MD  levothyroxine (SYNTHROID, LEVOTHROID) 100 MCG tablet Take 1 tablet by mouth daily.   Yes Historical Provider, MD  LORazepam (ATIVAN) 0.5 MG tablet Take 1 tablet by mouth 2 (two) times daily as needed for anxiety.    Yes Historical Provider, MD  losartan (COZAAR) 100 MG tablet Take 100 mg by mouth daily.   Yes Historical Provider, MD  metoprolol succinate (TOPROL-XL)  25 MG 24 hr tablet Take 1 tablet by mouth daily.   Yes Historical Provider, MD  pravastatin (PRAVACHOL) 80 MG tablet Take 1 tablet by mouth daily.   Yes Historical Provider, MD   Allergies  Allergen Reactions  . Sulfa Antibiotics Nausea Only     FAMILY HISTORY   Family History  Problem Relation Age of Onset  .         SOCIAL HISTORY    reports that she has quit smoking. She does not have any smokeless tobacco history on file. She reports that she does not drink alcohol. Her drug history is not on file.  Review of Systems  Unable to perform ROS: critical illness      VITAL SIGNS    Temp:  [98 F (36.7 C)-98.3 F (36.8 C)] 98.3 F (36.8 C) (08/19 0400) Pulse Rate:  [39-123] 48 (08/18 1500) Resp:  [22-36] 24 (08/19 0800) BP: (85-164)/(45-93) 98/45 mmHg (08/19 0800) SpO2:  [67 %-100 %] 98 % (08/19 0754) FiO2 (%):  [50 %-100 %] 50 % (08/19 0756) Weight:  [163 lb 9.3 oz (74.2 kg)] 163 lb 9.3 oz (74.2 kg) (08/19 0500) HEMODYNAMICS:   VENTILATOR SETTINGS: Vent Mode:  [-] PRVC FiO2 (%):  [50 %-100 %] 50 % Set Rate:  [24 bmp] 24 bmp Vt Set:  [450 mL-550 mL] 550 mL PEEP:  [12 cmH20] 12 cmH20 INTAKE / OUTPUT:  Intake/Output Summary (Last 24 hours) at 01/18/2015  1610 Last data filed at 01/02/15 1900  Gross per 24 hour  Intake 1898.6 ml  Output    875 ml  Net 1023.6 ml       PHYSICAL EXAM   Physical Exam  Constitutional: She appears distressed.  HENT:  Head: Normocephalic and atraumatic.  Eyes: Pupils are equal, round, and reactive to light. No scleral icterus.  Neck: Normal range of motion. Neck supple.  Cardiovascular: Normal rate and regular rhythm.   No murmur heard. Pulmonary/Chest: She is in respiratory distress. She has wheezes. She has rales.  resp distress  Abdominal: Soft. She exhibits no distension. There is no tenderness.  Musculoskeletal: She exhibits no edema.  Neurological: She displays normal reflexes. Coordination normal.  gcs<8T  Skin:  Skin is warm. No rash noted. She is diaphoretic.       LABS   LABS:  CBC  Recent Labs Lab Jan 12, 2015 0428 01/02/15 0748 12/24/2014 0420  WBC 8.7 5.7 4.2  HGB 14.8 15.4 12.7  HCT 44.1 47.1* 38.0  PLT 105* 94* 71*   Coag's No results for input(s): APTT, INR in the last 168 hours. BMET  Recent Labs Lab 12-Jan-2015 0428 01/02/15 0748 12/22/2014 0420  NA 136 133* 128*  K 3.8 4.5 5.0  CL 104 102 91*  CO2 22 18* 21*  BUN 16 23* 37*  CREATININE 1.44* 1.47* 2.89*  GLUCOSE 180* 131* 221*   Electrolytes  Recent Labs Lab 2015/01/12 0428 01/02/15 0748 01/04/2015 0420 12/26/2014 0427  CALCIUM 9.1 8.4* 6.9*  --   MG  --   --   --  1.7   Sepsis Markers  Recent Labs Lab 01/02/15 1658  LATICACIDVEN 7.1*   ABG  Recent Labs Lab 01/02/15 1500 01/02/15 2050  PHART 7.07* 7.10*  PCO2ART 51* 62*  PO2ART 146* 213*   Liver Enzymes  Recent Labs Lab 01/12/2015 0428 01/02/15 0748 12/20/2014 0420  AST 50* 47* 242*  ALT 25 18 145*  ALKPHOS 47 16* 9*  BILITOT 1.0 0.5 0.6  ALBUMIN 4.0 3.2* 2.1*   Cardiac Enzymes  Recent Labs Lab 2015-01-12 0428 01/02/15 1658 12/22/2014 0420  TROPONINI 0.03 0.35* 0.42*   Glucose  Recent Labs Lab 01/02/15 0942 01/02/15 1618 01/02/15 1818 01/02/15 2147 01/05/2015 0001 12/21/2014 0741  GLUCAP 128* 152* 170* 192* 215* 162*     Recent Results (from the past 240 hour(s))  Culture, blood (routine x 2)     Status: None (Preliminary result)   Collection Time: Jan 12, 2015  5:34 AM  Result Value Ref Range Status   Specimen Description BLOOD RIGHT ANTECUBITAL  Final   Special Requests BOTTLES DRAWN AEROBIC AND ANAEROBIC  Final   Culture NO GROWTH 2 DAYS  Final   Report Status PENDING  Incomplete  Culture, blood (routine x 2)     Status: None (Preliminary result)   Collection Time: 12-Jan-2015  5:35 AM  Result Value Ref Range Status   Specimen Description BLOOD BLOOD LEFT FOREARM  Final   Special Requests BOTTLES DRAWN AEROBIC AND ANAEROBIC   Final   Culture NO GROWTH 2 DAYS  Final   Report Status PENDING  Incomplete  Culture, expectorated sputum-assessment     Status: None   Collection Time: January 12, 2015 11:49 AM  Result Value Ref Range Status   Specimen Description SPUTUM  Final   Special Requests NONE  Final   Sputum evaluation   Final    Sputum specimen not acceptable for testing.  Please recollect.     Report  Status 12/25/2014 FINAL  Final  MRSA PCR Screening     Status: None   Collection Time: 01/02/15 11:35 AM  Result Value Ref Range Status   MRSA by PCR NEGATIVE NEGATIVE Final    Comment:        The GeneXpert MRSA Assay (FDA approved for NASAL specimens only), is one component of a comprehensive MRSA colonization surveillance program. It is not intended to diagnose MRSA infection nor to guide or monitor treatment for MRSA infections.   Culture, blood (routine x 2)     Status: None (Preliminary result)   Collection Time: 01/02/15  4:50 PM  Result Value Ref Range Status   Specimen Description BLOOD LEFT ASSIST CONTROL  Final   Special Requests BOTTLES DRAWN AEROBIC AND ANAEROBIC  Final   Culture NO GROWTH < 24 HOURS  Final   Report Status PENDING  Incomplete  Culture, blood (routine x 2)     Status: None (Preliminary result)   Collection Time: 01/02/15  4:58 PM  Result Value Ref Range Status   Specimen Description BLOOD LEFT ASSIST CONTROL  Final   Special Requests BOTTLES DRAWN AEROBIC AND ANAEROBIC  Final   Culture NO GROWTH < 24 HOURS  Final   Report Status PENDING  Incomplete     Current facility-administered medications:  .  acetaminophen (TYLENOL) tablet 650 mg, 650 mg, Per Tube, Q6H PRN **OR** acetaminophen (TYLENOL) suppository 650 mg, 650 mg, Rectal, Q6H PRN, Suan Halter, MD .  [EXPIRED] amiodarone (NEXTERONE PREMIX) 360 MG/200ML (1.8 mg/mL) IV infusion, 60 mg/hr, Intravenous, Continuous, Stopped at 01/02/15 1640 **FOLLOWED BY** amiodarone (NEXTERONE PREMIX) 360 MG/200ML (1.8 mg/mL)  IV infusion, 30 mg/hr, Intravenous, Continuous, Rise Mu, PA-C, Stopped at 20-Jan-2015 0800 .  antiseptic oral rinse solution (CORINZ), 7 mL, Mouth Rinse, QID, Suan Halter, MD, 7 mL at Jan 20, 2015 0402 .  budesonide (PULMICORT) nebulizer solution 0.5 mg, 0.5 mg, Nebulization, BID, Erin Fulling, MD, 0.5 mg at January 20, 2015 0748 .  chlorhexidine (PERIDEX) 0.12 % solution 15 mL, 15 mL, Mouth/Throat, BID, Erin Fulling, MD, 15 mL at 01/02/15 2219 .  fentaNYL in NS (49mcg/ml) infusion-PREMIX, 10 mcg/hr, Intravenous, Continuous, Erin Fulling, MD, Stopped at 01/20/2015 0856 .  heparin ADULT infusion 100 units/mL (25000 units/250 mL), 1,000 Units/hr, Intravenous, Continuous, Bertram Savin, [facility], Last Rate: 10 mL/hr at 2015-01-20 0403, 1,000 Units/hr at 01/20/15 0403 .  insulin aspart (novoLOG) injection 0-9 Units, 0-9 Units, Subcutaneous, Q6H, Suan Halter, MD, 3 Units at 01-20-2015 [phone] .  ipratropium-albuterol (DUONEB) 0.5-2.5 (3) MG/3ML nebulizer solution 3 mL, 3 mL, Nebulization, Q4H, Erin Fulling, MD, 3 mL at 01-20-2015 0748 .  levothyroxine (SYNTHROID, LEVOTHROID) tablet 100 mcg, 100 mcg, Per Tube, QAC breakfast, Suan Halter, MD .  methylPREDNISolone sodium succinate (SOLU-MEDROL) 125 mg/2 mL injection 60 mg, 60 mg, Intravenous, Q12H, Marguarite Arbour, MD, 60 mg at 01/02/15 2050 .  mometasone-formoterol (DULERA) 100-5 MCG/ACT inhaler 2 puff, 2 puff, Inhalation, BID, Arnaldo Natal, MD, 2 puff at 12/21/2014 2125 .  norepinephrine (LEVOPHED) 16 mg in dextrose 5 % 250 mL (0.064 mg/mL) infusion, 0-40 mcg/min, Intravenous, Titrated, Erin Fulling, MD, Last Rate: 23.4 mL/hr at 01/02/15 1900, 25 mcg/min at 01/02/15 1900 .  ondansetron (ZOFRAN) tablet 4 mg, 4 mg, Oral, Q6H PRN **OR** ondansetron (ZOFRAN) injection 4 mg, 4 mg, Intravenous, Q6H PRN, Arnaldo Natal, MD .  pantoprazole (PROTONIX) injection 40 mg, 40 mg, Intravenous, Daily, Suan Halter, MD, 40 mg at 01/02/15  1856 .   piperacillin-tazobactam (ZOSYN) IVPB 3.375 g, 3.375 g, Intravenous, 3 times per day, Bertram Savin, RPH, 3.375 g at 2015-01-09 0554 .  sodium bicarbonate 150 mEq in dextrose 5 % 1,000 mL infusion, , Intravenous, Continuous, Marguarite Arbour, MD, Last Rate: 150 mL/hr at 2015-01-09 0028 .  sodium chloride 0.9 % injection 3 mL, 3 mL, Intravenous, Q12H, Arnaldo Natal, MD, 3 mL at 01/02/15 2220 .  vancomycin (VANCOCIN) IVPB 750 mg/150 ml premix, 750 mg, Intravenous, Q24H, Bertram Savin, RPH, 750 mg at 2015/01/09 0050 .  vasopressin (PITRESSIN) 40 Units in sodium chloride 0.9 % 250 mL (0.16 Units/mL) infusion, 0.03 Units/min, Intravenous, Continuous, Erin Fulling, MD, Last Rate: 11.3 mL/hr at 01/02/15 1613, 0.03 Units/min at 01/02/15 1613  IMAGING    Portable Chest Xray  2015/01/09   CLINICAL DATA:  Hypoxia  EXAM: PORTABLE CHEST - 1 VIEW  COMPARISON:  January 02, 2015  FINDINGS: Endotracheal tube tip is 6.0 cm above the carina. Nasogastric tube tip and side port are below the diaphragm. Central catheter tip is in the superior vena cava. No pneumothorax. Extensive airspace consolidation in the left upper lobe stable. There has been interval partial clearing of infiltrate from the left base. Interstitial prominence does remain throughout the left lung. The right lung is clear. Heart size and pulmonary vascularity are normal. No adenopathy.  IMPRESSION: Tube and catheter positions as described without pneumothorax. Persistent consolidation left upper lobe. Significant partial clearing left base. Diffuse interstitial prominence throughout the left lung remains, probably representing interstitial pneumonitis. Right lung clear. No change in cardiac silhouette.   Electronically Signed   By: Bretta Bang III M.D.   On: 01-09-15 07:54   Dg Chest Port 1 View  01/02/2015   CLINICAL DATA:  Hypoxia  EXAM: PORTABLE CHEST - 1 VIEW  COMPARISON:  Study obtained earlier in the day  FINDINGS: Endotracheal tube tip is  7.0 cm above the carina. Central catheter tip is in the superior cava. Nasogastric tube tip and side port below the diaphragm. No pneumothorax. Airspace consolidation is noted in the left upper lobe and left base. There is interstitial edema throughout the remainder of the left lung. The right lung is clear. Lungs are hyperexpanded. Heart size and pulmonary vascularity are within normal limits. There is no demonstrable adenopathy. No bone lesions.  IMPRESSION: Tube and catheter positions as described without pneumothorax. Extensive pneumonia on the left with both interstitial and alveolar consolidation. Right lung clear. Lungs hyperexpanded. Cardiac silhouette within normal limits.   Electronically Signed   By: Bretta Bang III M.D.   On: 01/02/2015 15:41   Dg Chest Port 1 View  01/02/2015   CLINICAL DATA:  Central line placement  EXAM: PORTABLE CHEST - 1 VIEW  COMPARISON:  12/29/2014  FINDINGS: Cardiomediastinal silhouette is stable. There is right IJ central line with tip in SVC right atrium junction. No pneumothorax. Persistent left lung airspace is and left upper lobe consolidation.  IMPRESSION: There is right IJ central line with tip in SVC right atrium junction. No pneumothorax. Persistent left lung airspace is and left upper lobe consolidation.   Electronically Signed   By: Natasha Mead M.D.   On: 01/02/2015 10:21   Dg Abd Portable 1v  01/02/2015   CLINICAL DATA:  NG tube placement  EXAM: PORTABLE ABDOMEN - 1 VIEW  COMPARISON:  None.  FINDINGS: Enteric tube terminates in the proximal gastric body.  Moderate gastric distention.  Mild abdomen gas pattern.  Mild  degenerative changes of the lower lumbar spine.  IMPRESSION: Enteric tube terminates in the proximal gastric body.   Electronically Signed   By: Charline Bills M.D.   On: 01/02/2015 15:42      Indwelling Urinary Catheter continued, requirement due to   Reason to continue Indwelling Urinary Catheter for strict Intake/Output monitoring  for hemodynamic instability   Central Line continued, requirement due to   Reason to continue [employer] Monitoring of central venous pressure or other hemodynamic parameters   Ventilator continued, requirement due to, resp failure    Ventilator Sedation RASS 0 to -2   MAJOR EVENTS/TEST RESULTS: Intubated 8/18 CXR c/w pneumonia  INDWELLING DEVICES:: RT CVL 8/18>>  MICRO DATA: MRSA PCR negative Urine  Blood Resp   ANTIMICROBIALS: Vancomycin and Zosyn day 1    ASSESSMENT/PLAN   81 yo white female admitted to ICU for acute and severe resp failure from acute COPD exacerbation with acute Pneumonia with acute afib with RVR with septic shock   PULMONARY 1.Respiratory Failure -continue Full MV support -continue Bronchodilator Therapy -Wean Fio2 and PEEP as tolerated -COPD exacerbation -BD therapy, IV steroids, IV abx   CARDIOVASCULAR Septic shock -use vasopressors to keep MAP>65 -follow ABG and LA -follow up cultures -emperic ABX   RENAL Renal Failure-most likely due to ATN -follow chem 7 -follow UO -continue Foley Catheter-assess need   GASTROINTESTINAL Place OG-place to suction GI prx  HEMATOLOGIC Follow h/h  INFECTIOUS Pneumonia -follow up cultures Empiric abx  ENDOCRINE - ICU hypoglycemic\Hyperglycemia protocol   NEUROLOGIC - intubated and sedated - minimal sedation to achieve a RASS goal: -1  Patient limited CODE  I have personally obtained a history, examined the patient, evaluated laboratory and independently reviewed  imaging results, formulated the assessment and plan and placed orders.  The Patient requires high complexity decision making for assessment and support, frequent evaluation and titration of therapies, application of advanced monitoring technologies and extensive interpretation of multiple databases. Critical Care Time devoted to patient care services described in this note is 45 minutes.   Overall, patient is critically  ill, prognosis is guarded. Patient at high risk for cardiac arrest and death.    Lucie Leather, M.D.  [facility]  Medical Director [facility] Medical Director [facility] Cardio-Pulmonary Department

## 2015-01-16 NOTE — Progress Notes (Signed)
Pt became asystole on monitor, MD at [facility], pt given at 0858 atropine, 0901 1 mg of epi, 0904 1 mg of epi, RT at [facility] with respiration support.  Pt pronounced at 0905 by MD-Kasa, family at [facility], emotional support given, nursing will cont to monitor, family needs

## 2015-01-16 NOTE — Discharge Summary (Signed)
Breanna Ford, is a 81 y.o. female  DOB [birth year]  MRN [id number].  Admission date:  01/08/2015  Admitting Physician  Arnaldo Natal, MD  Discharge Date:  01/07/2015   Primary MD  Rendi Mapel D, MD  Recommendations for primary care physician for things to follow:   none   Admission Diagnosis  Rigors [R68.89] CAP (community acquired pneumonia) [J18.9] SIRS (systemic inflammatory response syndrome) [A41.9]   Discharge Diagnosis  Rigors [R68.89] CAP (community acquired pneumonia) [J18.9] SIRS (systemic inflammatory response syndrome) [A41.9]  Acute respiratory failure  Active Problems:   CAP (community acquired pneumonia)   SIRS (systemic inflammatory response syndrome)   Septic shock   COPD exacerbation   Acute respiratory failure      Past Medical History  Diagnosis Date  . COPD (chronic obstructive pulmonary disease)   . Hypertension     Past Surgical History  Procedure Laterality Date  . Tonsillectomy and adenoidectomy    . Cholecystectomy    . Abdominal hysterectomy         History of present illness and  Hospital Course:     Kindly see H&P for history of present illness and admission details, please review complete Labs, Consult reports and Test reports for all details in brief  HPI  from the history and physical done on the day of admission    Hospital Course    Pt admitted with SIRS and CAP. Developed worsening respiratory distress and rapid a-fib. Was moved to ICU where she was intubated. Made Limited Code by family. Expired in the ICU with family present   Discharge Condition: deceased   Follow UP      Discharge Instructions  and  Discharge Medications   none     Medication List    ASK your doctor about these medications        ADVAIR DISKUS 250-50 MCG/DOSE Aepb  Generic drug:  Fluticasone-Salmeterol  Inhale 1 puff into the lungs 2 (two)  times daily.     albuterol 108 (90 BASE) MCG/ACT inhaler  Commonly known as:  PROVENTIL HFA;VENTOLIN HFA  Inhale 2 puffs into the lungs every 4 (four) hours as needed for wheezing or shortness of breath.     amLODipine 5 MG tablet  Commonly known as:  NORVASC  Take 1 tablet by mouth daily.     busPIRone 15 MG tablet  Commonly known as:  BUSPAR  Take 15 mg by mouth 2 (two) times daily.     diphenhydrAMINE 25 mg capsule  Commonly known as:  BENADRYL  Take 25 mg by mouth every evening.     fluticasone 50 MCG/ACT nasal spray  Commonly known as:  FLONASE  Place 2 sprays into both nostrils daily.     hydrochlorothiazide 25 MG tablet  Commonly known as:  HYDRODIURIL  Take 25 mg by mouth daily.     levothyroxine 100 MCG tablet  Commonly known as:  SYNTHROID, LEVOTHROID  Take 1 tablet by mouth daily.     LORazepam 0.5 MG tablet  Commonly known as:  ATIVAN  Take 1 tablet by mouth 2 (two) times daily as needed for anxiety.     losartan 100 MG tablet  Commonly known as:  COZAAR  Take 100 mg by mouth daily.     metoprolol succinate 25 MG 24 hr tablet  Commonly known as:  TOPROL-XL  Take 1 tablet by mouth daily.     pravastatin 80 MG tablet  Commonly known as:  PRAVACHOL  Take 1 tablet by mouth daily.          Diet and Activity recommendation: See Discharge Instructions above   Consults obtained - Pulmonary, Cardiology, Nephrology   Major procedures and Radiology Reports - PLEASE review detailed and final reports for all details, in brief -   See below   Dg Chest 2 View  01/02/2015   CLINICAL DATA:  COPD.  EXAM: CHEST  2 VIEW  COMPARISON:  12/22/2014 .  FINDINGS: Mediastinum and hilar structures are normal. Progressive left upper lobe infiltrate and atelectasis. Interim appearance of diffuse left lower lobe infiltrate is present. Left pleural effusion. Right lung is clear. No pneumothorax. Heart size normal. Prior cervical spine fusion.  IMPRESSION: 1. Progressive  left upper lobe infiltrate and atelectasis. Interim appearance of diffuse left lower lobe infiltrate.  2.  Small left pleural effusion.   Electronically Signed   By: Maisie Fus  Register   On: 01/02/2015 07:47   Portable Chest Xray  01-29-2015   CLINICAL DATA:  Hypoxia  EXAM: PORTABLE CHEST - 1 VIEW  COMPARISON:  January 02, 2015  FINDINGS: Endotracheal tube tip is 6.0 cm above the carina. Nasogastric tube tip and side port are below the diaphragm. Central catheter tip is in the superior vena cava. No pneumothorax. Extensive airspace consolidation in the left upper lobe stable. There has been interval partial clearing of infiltrate from the left base. Interstitial prominence does remain throughout the left lung. The right lung is clear. Heart size and pulmonary vascularity are normal. No adenopathy.  IMPRESSION: Tube and catheter positions as described without pneumothorax. Persistent consolidation left upper lobe. Significant partial clearing left base. Diffuse interstitial prominence throughout the left lung remains, probably representing interstitial pneumonitis. Right lung clear. No change in cardiac silhouette.   Electronically Signed   By: Bretta Bang III M.D.   On: 01-29-15 07:54   Dg Chest Port 1 View  01/02/2015   CLINICAL DATA:  Hypoxia  EXAM: PORTABLE CHEST - 1 VIEW  COMPARISON:  Study obtained earlier in the day  FINDINGS: Endotracheal tube tip is 7.0 cm above the carina. Central catheter tip is in the superior cava. Nasogastric tube tip and side port below the diaphragm. No pneumothorax. Airspace consolidation is noted in the left upper lobe and left base. There is interstitial edema throughout the remainder of the left lung. The right lung is clear. Lungs are hyperexpanded. Heart size and pulmonary vascularity are within normal limits. There is no demonstrable adenopathy. No bone lesions.  IMPRESSION: Tube and catheter positions as described without pneumothorax. Extensive pneumonia on the  left with both interstitial and alveolar consolidation. Right lung clear. Lungs hyperexpanded. Cardiac silhouette within normal limits.   Electronically Signed   By: Bretta Bang III M.D.   On: 01/02/2015 15:41   Dg Chest Port 1 View  01/02/2015   CLINICAL DATA:  Central line placement  EXAM: PORTABLE CHEST - 1 VIEW  COMPARISON:  01/15/2015  FINDINGS: Cardiomediastinal silhouette is stable. There is right IJ central line with tip in  SVC right atrium junction. No pneumothorax. Persistent left lung airspace is and left upper lobe consolidation.  IMPRESSION: There is right IJ central line with tip in SVC right atrium junction. No pneumothorax. Persistent left lung airspace is and left upper lobe consolidation.   Electronically Signed   By: Natasha Mead M.D.   On: 01/02/2015 10:21   Dg Chest Port 1 View  01/28/15   CLINICAL DATA:  Initial evaluation for acute shortness of breath, cough, left-sided chest pain. History of COPD.  EXAM: PORTABLE CHEST - 1 VIEW  COMPARISON:  Prior radiograph from 07/20/2014  FINDINGS: Cardiac and mediastinal silhouettes are stable in size and contour, and remain within normal limits.  Lungs are normally inflated. Changes related COPD noted. There is patchy multi focal infiltrate within the medial left upper lobe, suspicious for pneumonia in the setting of cough. No other focal infiltrates. No pulmonary edema or pleural effusion. No pneumothorax.  No acute osseous abnormality.  IMPRESSION: 1. Findings concerning for acute left upper lobe pneumonia. 2. COPD.   Electronically Signed   By: Rise Mu M.D.   On: 01-28-15 05:16   Dg Abd Portable 1v  01/02/2015   CLINICAL DATA:  NG tube placement  EXAM: PORTABLE ABDOMEN - 1 VIEW  COMPARISON:  None.  FINDINGS: Enteric tube terminates in the proximal gastric body.  Moderate gastric distention.  Mild abdomen gas pattern.  Mild degenerative changes of the lower lumbar spine.  IMPRESSION: Enteric tube terminates in the proximal  gastric body.   Electronically Signed   By: Charline Bills M.D.   On: 01/02/2015 15:42    Micro Results   See below  Recent Results (from the past 240 hour(s))  Culture, blood (routine x 2)     Status: None   Collection Time: 2015-01-28  5:34 AM  Result Value Ref Range Status   Specimen Description BLOOD RIGHT ANTECUBITAL  Final   Special Requests BOTTLES DRAWN AEROBIC AND ANAEROBIC  Final   Culture NO GROWTH 5 DAYS  Final   Report Status 01/06/2015 FINAL  Final  Culture, blood (routine x 2)     Status: None   Collection Time: 01-28-2015  5:35 AM  Result Value Ref Range Status   Specimen Description BLOOD BLOOD LEFT FOREARM  Final   Special Requests BOTTLES DRAWN AEROBIC AND ANAEROBIC  Final   Culture NO GROWTH 5 DAYS  Final   Report Status 01/06/2015 FINAL  Final  Culture, expectorated sputum-assessment     Status: None   Collection Time: 28-Jan-2015 11:49 AM  Result Value Ref Range Status   Specimen Description SPUTUM  Final   Special Requests NONE  Final   Sputum evaluation   Final    Sputum specimen not acceptable for testing.  Please recollect.     Report Status 01/28/2015 FINAL  Final  MRSA PCR Screening     Status: None   Collection Time: 01/02/15 11:35 AM  Result Value Ref Range Status   MRSA by PCR NEGATIVE NEGATIVE Final    Comment:        The GeneXpert MRSA Assay (FDA approved for NASAL specimens only), is one component of a comprehensive MRSA colonization surveillance program. It is not intended to diagnose MRSA infection nor to guide or monitor treatment for MRSA infections.   Culture, blood (routine x 2)     Status: None   Collection Time: 01/02/15  4:50 PM  Result Value Ref Range Status   Specimen Description BLOOD LEFT ASSIST  CONTROL  Final   Special Requests BOTTLES DRAWN AEROBIC AND ANAEROBIC  Final   Culture  Setup Time   Final    GRAM NEGATIVE RODS AEROBIC BOTTLE ONLY CRITICAL RESULT CALLED TO, READ BACK BY AND VERIFIED WITH: PATIENT  EXPIRED AT 0900 05-Jan-2015.PMH CONFIRMED BY [facility]    Culture PSEUDOMONAS AERUGINOSA AEROBIC BOTTLE ONLY   Final   Report Status 01/06/2015 FINAL  Final   Organism ID, Bacteria PSEUDOMONAS AERUGINOSA  Final      Susceptibility   Pseudomonas aeruginosa - MIC*    CEFTAZIDIME 4 SENSITIVE Sensitive     CIPROFLOXACIN <=0.25 SENSITIVE Sensitive     GENTAMICIN 2 SENSITIVE Sensitive     IMIPENEM 2 SENSITIVE Sensitive     PIP/TAZO Value in next row Sensitive      SENSITIVE8    * PSEUDOMONAS AERUGINOSA  Culture, blood (routine x 2)     Status: None   Collection Time: 01/02/15  4:58 PM  Result Value Ref Range Status   Specimen Description BLOOD LEFT ASSIST CONTROL  Final   Special Requests BOTTLES DRAWN AEROBIC AND ANAEROBIC  Final   Culture  Setup Time   Final    GRAM NEGATIVE RODS AEROBIC BOTTLE ONLY CRITICAL RESULT CALLED TO, READ BACK BY AND VERIFIED WITH: PATIENT EXPIRED Jan 05, 2015 0900 [facility] CONFIRMED BY [facility]    Culture PSEUDOMONAS AERUGINOSA AEROBIC BOTTLE ONLY   Final   Report Status 01/06/2015 FINAL  Final   Organism ID, Bacteria PSEUDOMONAS AERUGINOSA  Final      Susceptibility   Pseudomonas aeruginosa - MIC*    CEFTAZIDIME 4 SENSITIVE Sensitive     CIPROFLOXACIN <=0.25 SENSITIVE Sensitive     GENTAMICIN <=1 SENSITIVE Sensitive     IMIPENEM 2 SENSITIVE Sensitive     PIP/TAZO Value in next row Sensitive      SENSITIVE8    * PSEUDOMONAS AERUGINOSA       Today   Subjective:   Reggy Eye expired on this day  Objective:    Exam  Data Review   CBC w Diff:  Lab Results  Component Value Date   WBC 4.2 01-05-2015   WBC 7.4 01/04/2014   HGB 12.7 01-05-15   HGB 14.6 01/04/2014   HCT 38.0 Jan 05, 2015   HCT 44.5 01/04/2014   PLT 71* Jan 05, 2015   PLT 116* 01/04/2014   LYMPHOPCT 42 01/05/15   LYMPHOPCT 45.4 01/10/2013   BANDSPCT 13* Jan 05, 2015   MONOPCT 8 January 05, 2015   MONOPCT 9.8 01/10/2013   EOSPCT 0 Jan 05, 2015   EOSPCT 5.2 01/10/2013   BASOPCT 0  01-05-15   BASOPCT 0.7 01/10/2013    CMP:  Lab Results  Component Value Date   NA 128* 01-05-2015   NA 141 01/04/2014   K 5.0 2015/01/05   K 4.4 01/04/2014   CL 91* January 05, 2015   CL 109* 01/04/2014   CO2 21* 01/05/2015   CO2 23 01/04/2014   BUN 37* 01/05/15   BUN 19* 01/04/2014   CREATININE 2.89* 01/05/2015   CREATININE 1.18 01/04/2014   PROT 4.4* 2015/01/05   PROT 7.5 01/04/2014   ALBUMIN 2.1* 01-05-15   ALBUMIN 3.9 01/04/2014   BILITOT 0.6 Jan 05, 2015   BILITOT 0.6 01/04/2014   ALKPHOS 9* 01-05-2015   ALKPHOS 69 01/04/2014   AST 242* 2015/01/05   AST 39* 01/04/2014   ALT 145* 05-Jan-2015   ALT 26 01/04/2014  .   Total Time in preparing paper work, data evaluation and todays exam - 35 minutes  Shar Paez  D M.D on 01/07/2015 at 5:02 PM

## 2015-01-16 DEATH — deceased

## 2016-11-29 IMAGING — MR MRI HEAD WITHOUT CONTRAST
10 series · 48 of 48 positions shown · non-contrast
Comparison: Head CT 07/20/2014 and MRI 08/30/2007

CLINICAL DATA: Left arm weakness and numbness. Dizziness. Symptoms
began yesterday.

EXAM:
MRI HEAD WITHOUT CONTRAST
TECHNIQUE: Multiplanar, multiecho pulse sequences of the brain and surrounding
structures were obtained without intravenous contrast.

[Series 2: T1 · sagittal · 5.0mm · 0.45mm/px · 4 of 28 slices shown (1 of 2)]
[im 1/28]
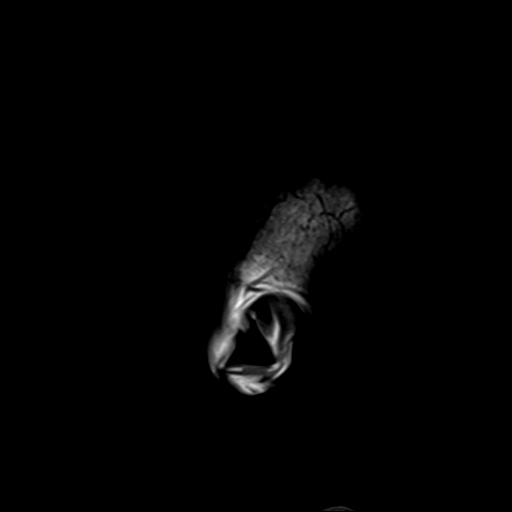
[im 10/28]
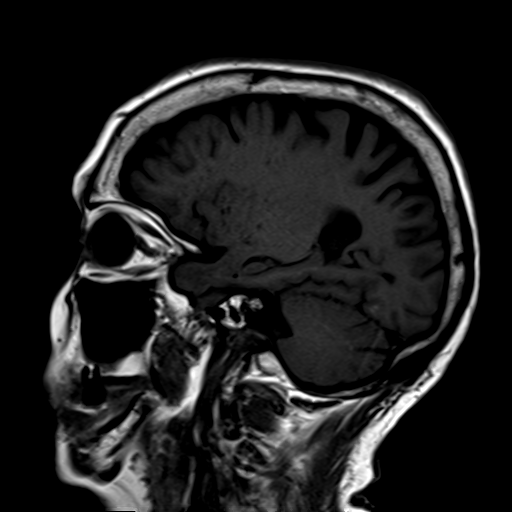
[im 19/28]
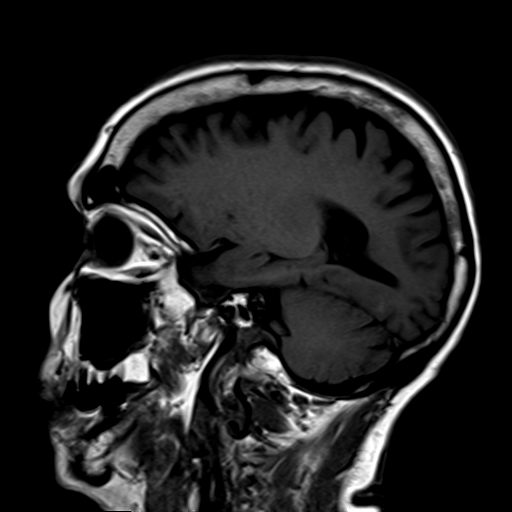
[im 28/28]
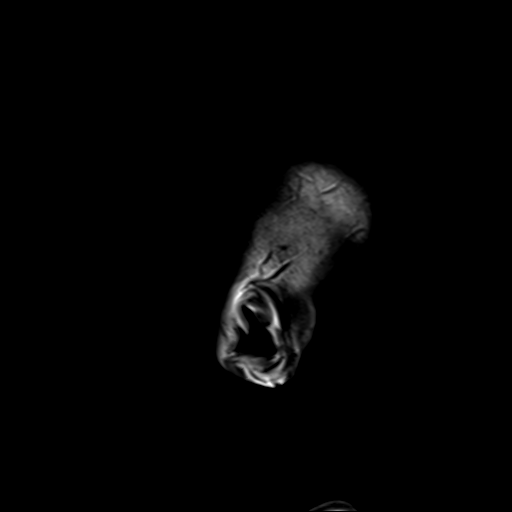

[Series 4: DWI · axial · 3.0mm · 1.80mm/px · z∈[-65,+92]mm · 6 of 52 slices shown (1 of 4)]
[im 1/52]
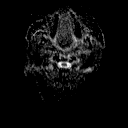
[im 11/52]
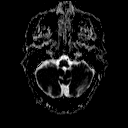
[im 21/52]
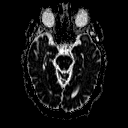
[im 31/52]
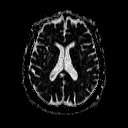
[im 41/52]
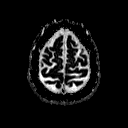
[im 52/52]
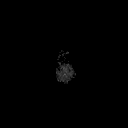

[Series 6: DWI · coronal · 3.0mm · 1.80mm/px · 6 of 51 slices shown (2 of 4)]
[im 1/51]
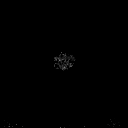
[im 11/51]
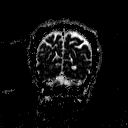
[im 21/51]
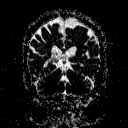
[im 31/51]
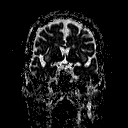
[im 41/51]
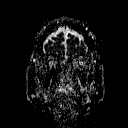
[im 51/51]
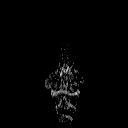

[Series 7: T2 · axial · 5.0mm · 0.60mm/px · z∈[-66,+95]mm · 3 of 25 slices shown (1 of 3)]
[im 1/25]
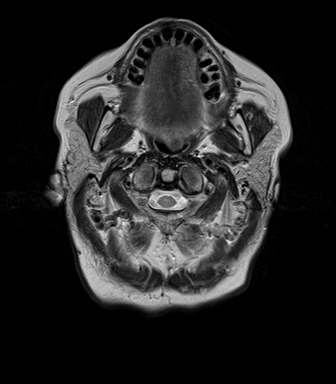
[im 13/25]
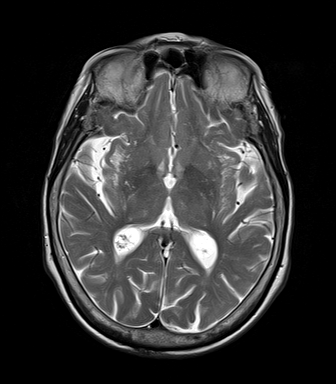
[im 25/25]
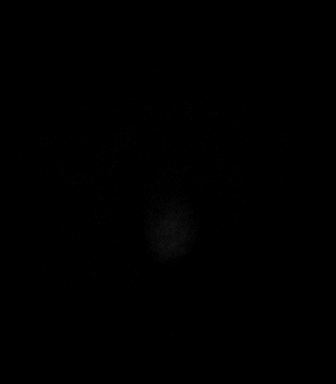

[Series 8: FLAIR · axial · 5.0mm · 0.45mm/px · z∈[-66,+95]mm · 3 of 26 slices shown]
[im 1/26]
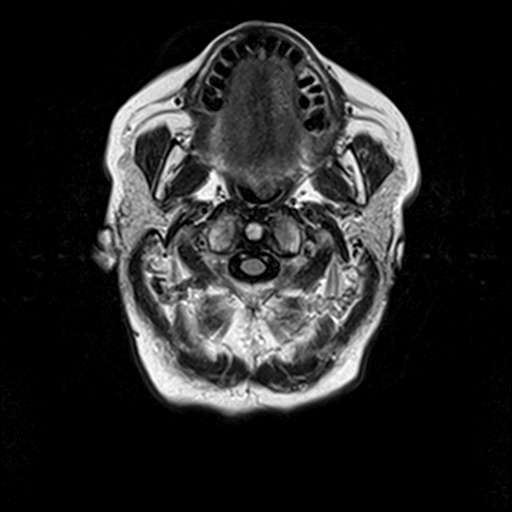
[im 13/26]
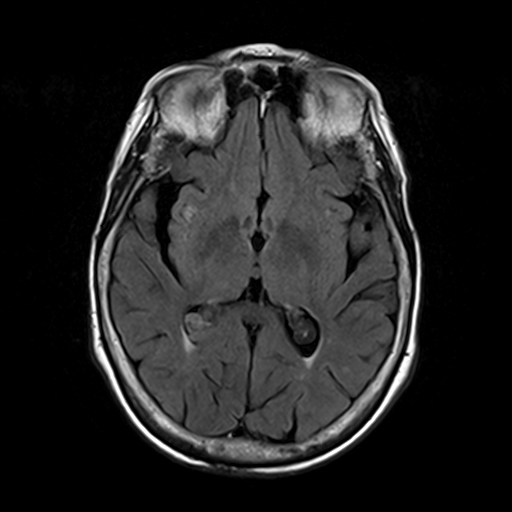
[im 26/26]
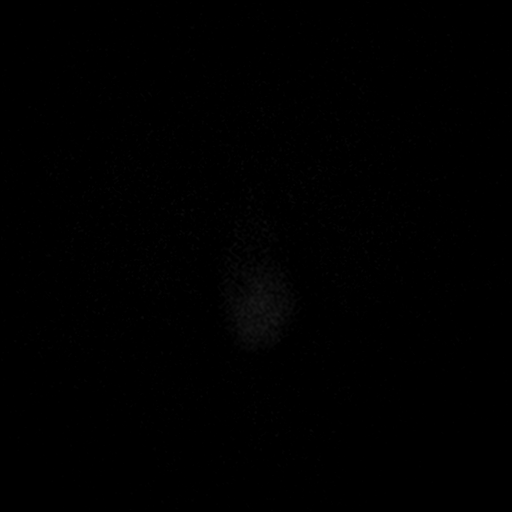

[Series 9: T2 · axial · 5.0mm · 0.45mm/px · z∈[-66,+95]mm · 3 of 26 slices shown (2 of 3)]
[im 1/26]
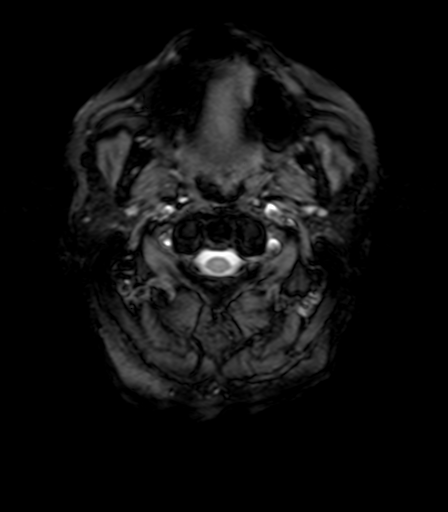
[im 13/26]
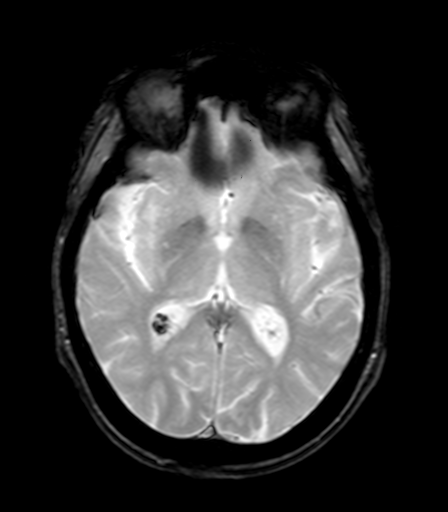
[im 26/26]
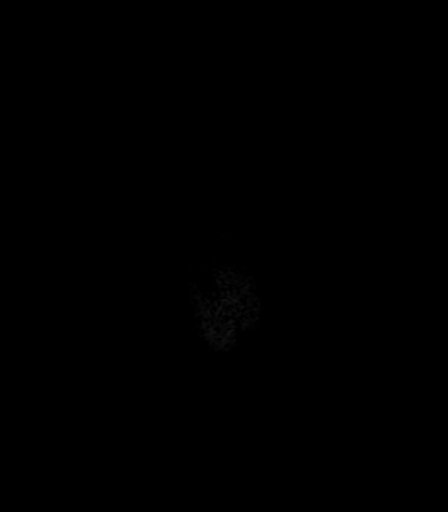

[Series 10: T1 · axial · 3.0mm · 1.00mm/px · z∈[-71,+104]mm · 7 of 60 slices shown (2 of 2)]
[im 1/60]
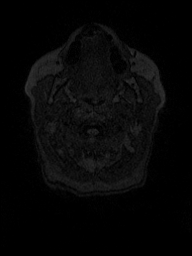
[im 10/60]
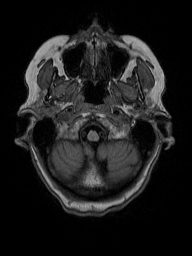
[im 20/60]
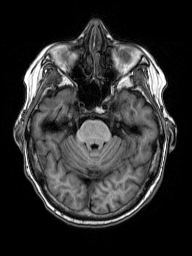
[im 30/60]
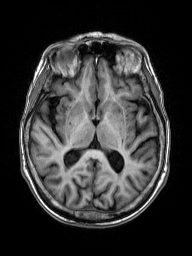
[im 40/60]
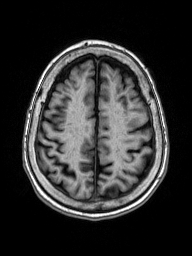
[im 50/60]
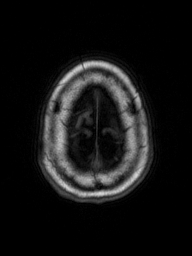
[im 60/60]
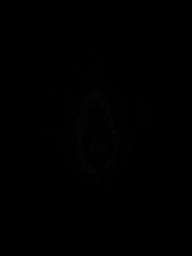

[Series 11: T2 · coronal · 5.0mm · 0.49mm/px · 4 of 31 slices shown (3 of 3)]
[im 1/31]
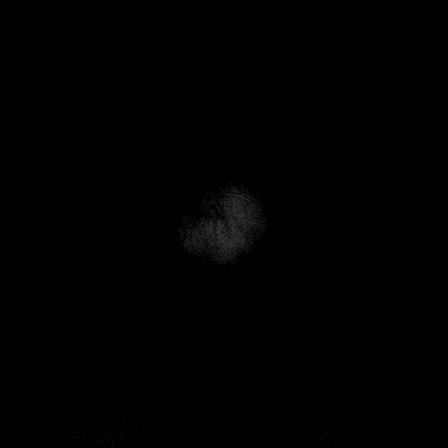
[im 11/31]
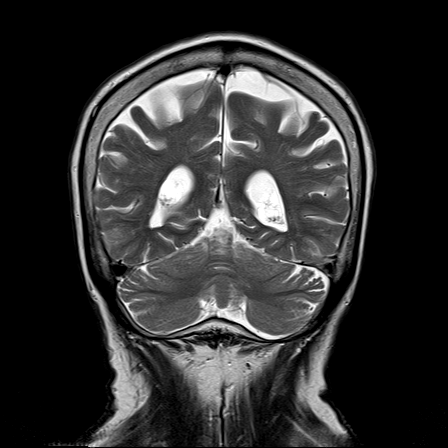
[im 21/31]
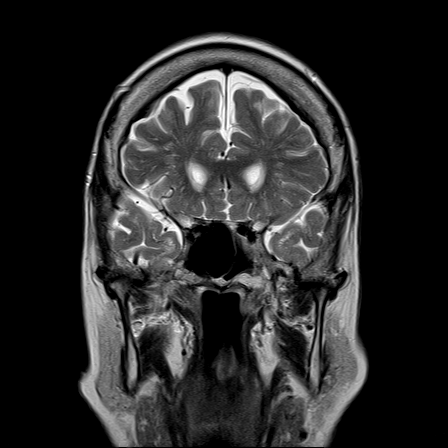
[im 31/31]
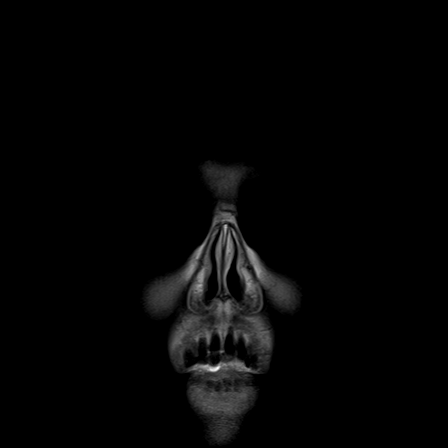

[Series 100: DWI · axial · 3.0mm · 1.80mm/px · z∈[-56,+95]mm · 6 of 52 slices shown (3 of 4)]
[im 1/52]
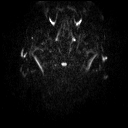
[im 11/52]
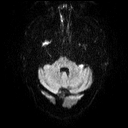
[im 21/52]
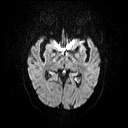
[im 31/52]
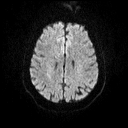
[im 41/52]
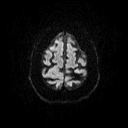
[im 52/52]
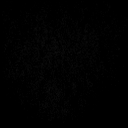

[Series 101: DWI · coronal · 3.0mm · 1.80mm/px · 6 of 51 slices shown (4 of 4)]
[im 1/51]
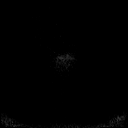
[im 11/51]
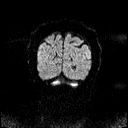
[im 21/51]
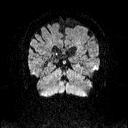
[im 31/51]
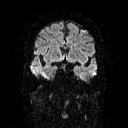
[im 41/51]
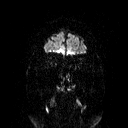
[im 51/51]
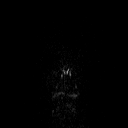

[48 of 48 positions shown; findings below may reference images not displayed]

FINDINGS: There is no evidence of acute infarct, intracranial hemorrhage,
mass, midline shift, or extra-axial fluid collection. There is mild
generalized cerebral atrophy. Small foci of T2 hyperintensity
scattered throughout the cerebral white matter bilaterally have
slightly progressed from the prior MRI and are nonspecific but
compatible with mild chronic small vessel ischemic disease.

Prior bilateral cataract extraction is noted. Paranasal sinuses and
mastoid air cells are clear. Major intracranial vascular flow voids
are preserved.
IMPRESSION: 1. No acute intracranial abnormality.
2. Mild chronic small vessel ischemic disease.

## 2017-05-13 IMAGING — CR DG ABD PORTABLE 1V
1 series · 1 of 1 positions shown · non-contrast
Comparison: None.

CLINICAL DATA: NG tube placement

EXAM:
PORTABLE ABDOMEN - 1 VIEW

[ap]
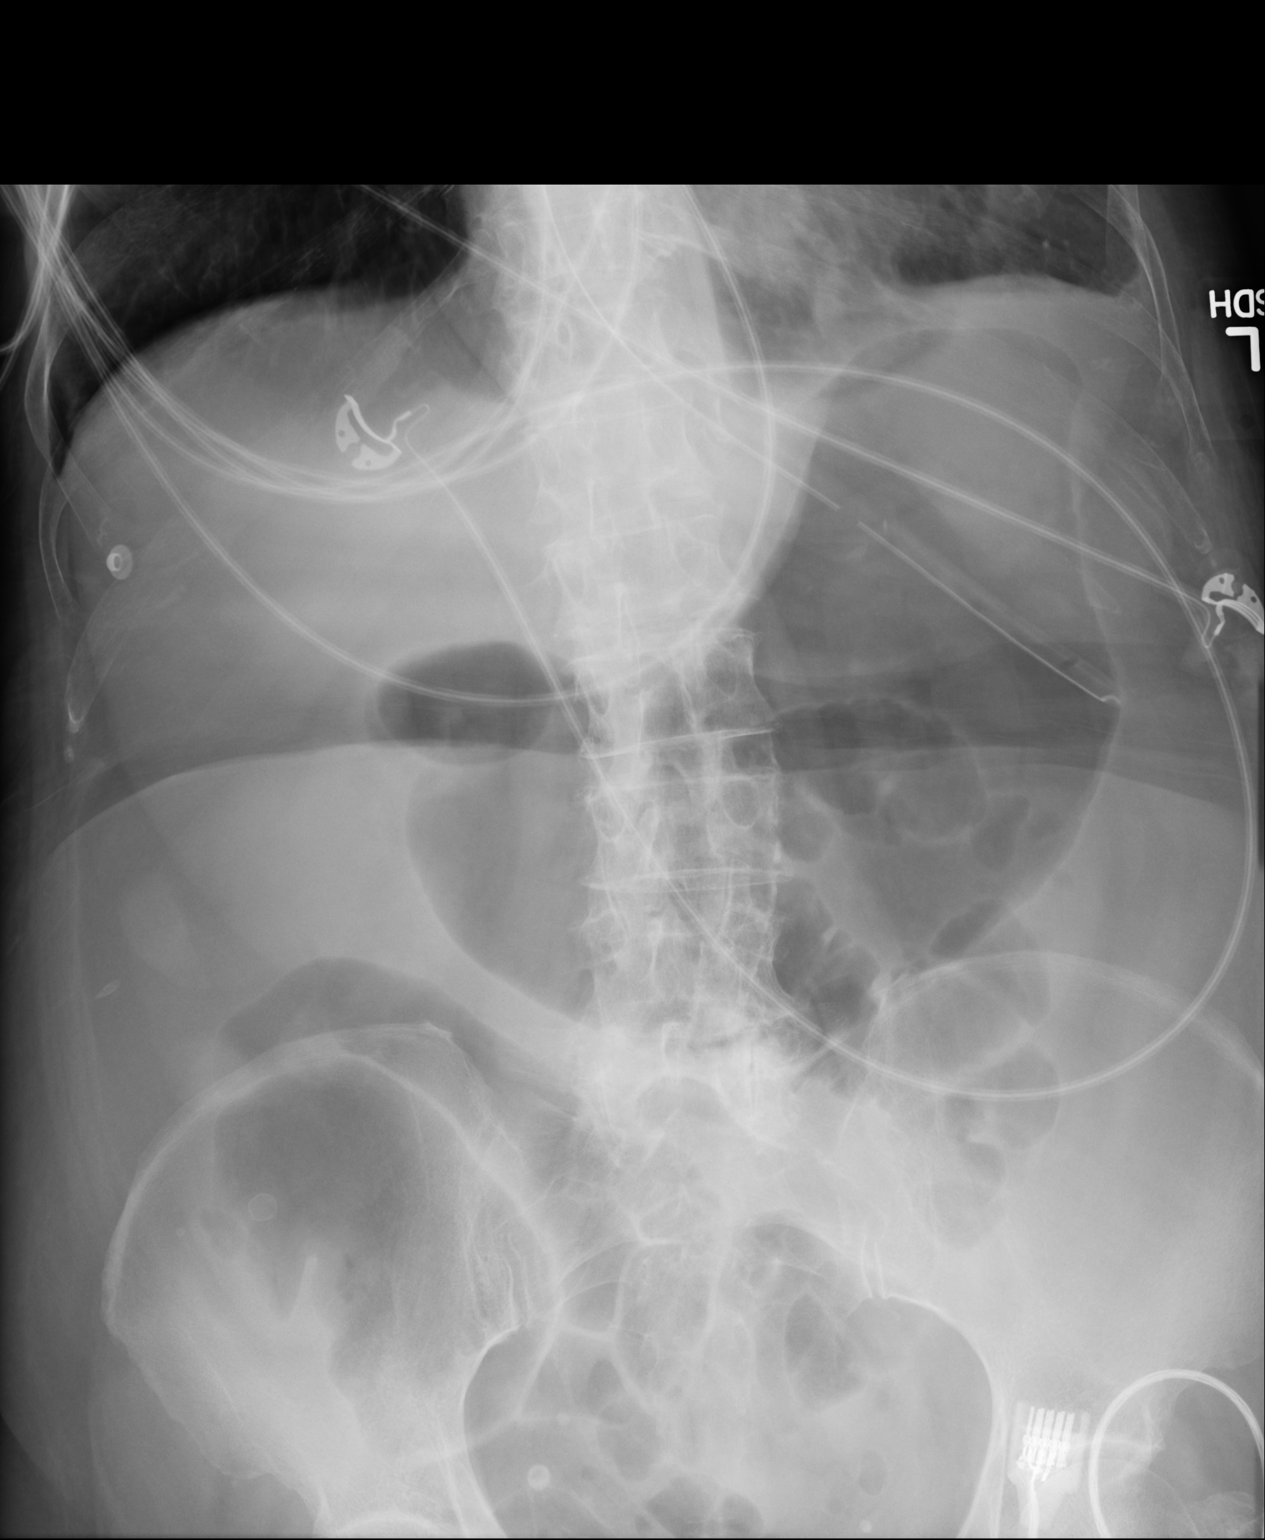

[1 of 1 positions shown; findings below may reference images not displayed]

FINDINGS: Enteric tube terminates in the proximal gastric body.

Moderate gastric distention.

Mild abdomen gas pattern.

Mild degenerative changes of the lower lumbar spine.
IMPRESSION: Enteric tube terminates in the proximal gastric body.

## 2017-05-14 IMAGING — CR DG CHEST 1V PORT
1 series · 1 of 1 positions shown · non-contrast
Comparison: January 02, 2015

CLINICAL DATA: Hypoxia

EXAM:
PORTABLE CHEST - 1 VIEW

[ap]
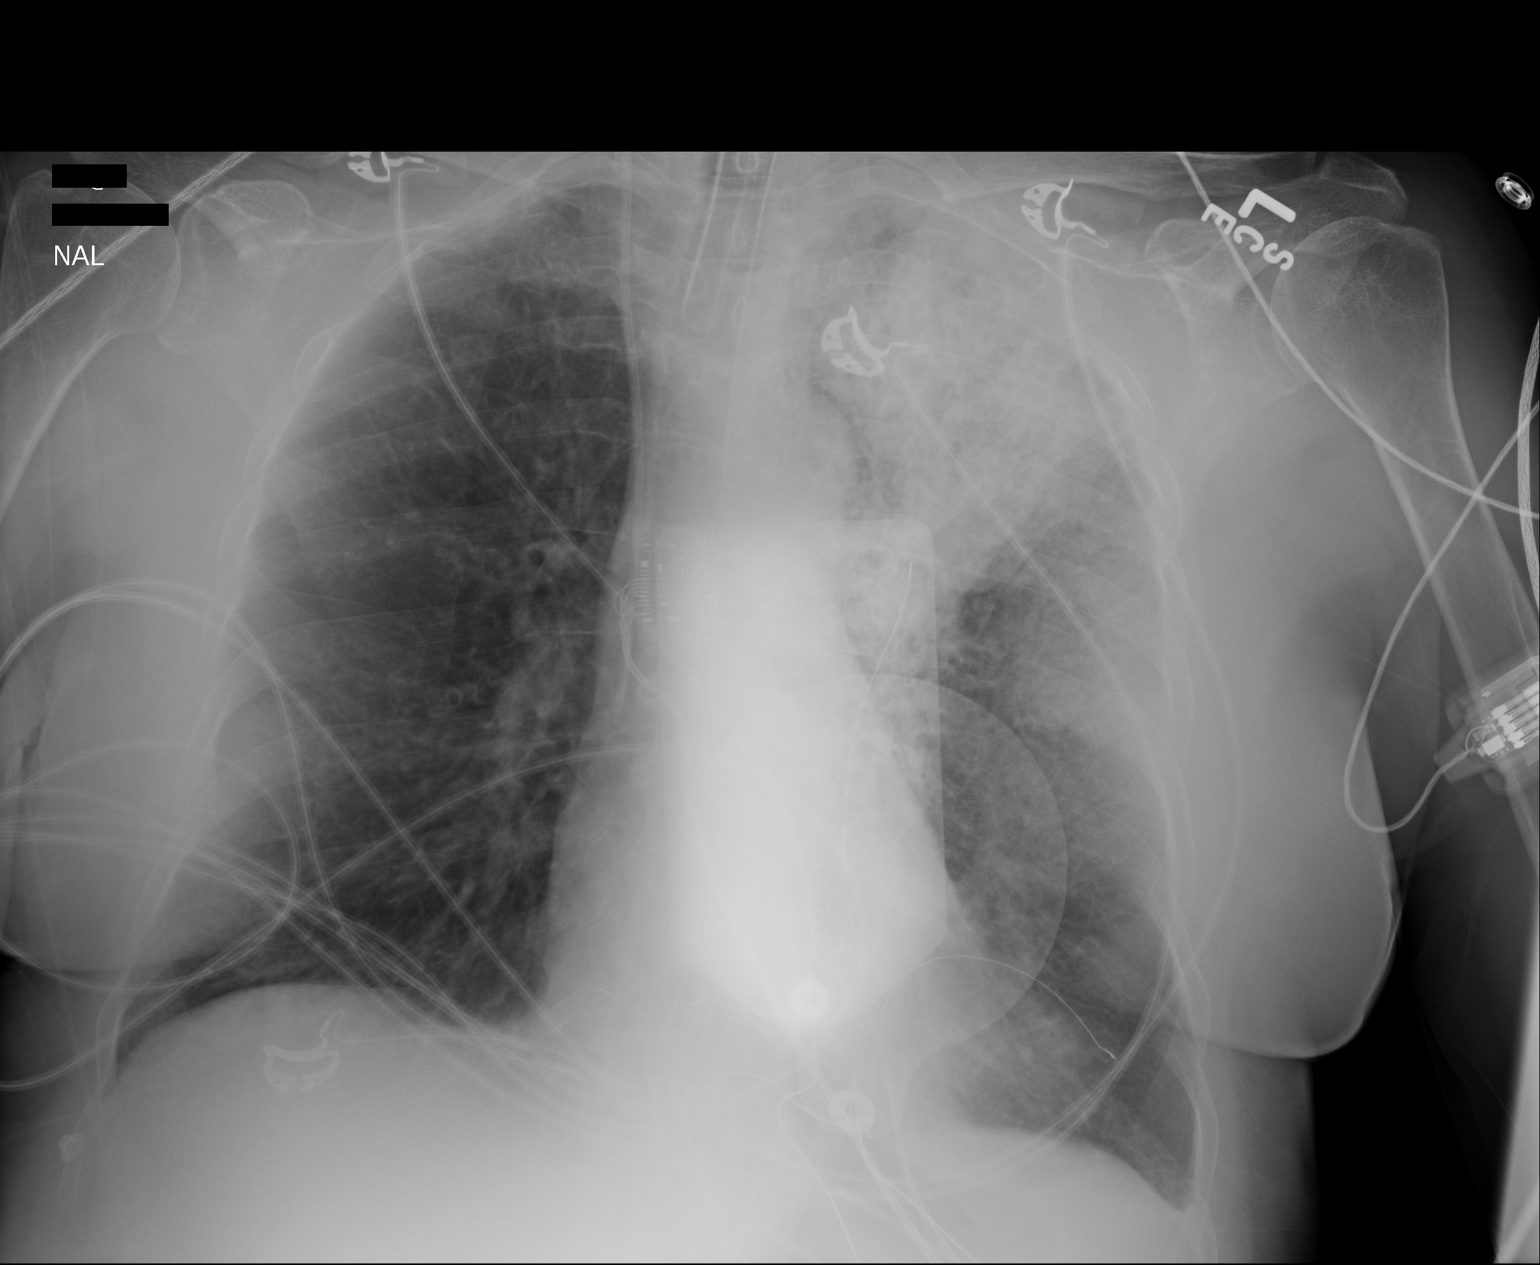

[1 of 1 positions shown; findings below may reference images not displayed]

FINDINGS: Endotracheal tube tip is 6.0 cm above the carina. Nasogastric tube
tip and side port are below the diaphragm. Central catheter tip is
in the superior vena cava. No pneumothorax. Extensive airspace
consolidation in the left upper lobe stable. There has been interval
partial clearing of infiltrate from the left base. Interstitial
prominence does remain throughout the left lung. The right lung is
clear. Heart size and pulmonary vascularity are normal. No
adenopathy.
IMPRESSION: Tube and catheter positions as described without pneumothorax.
Persistent consolidation left upper lobe. Significant partial
clearing left base. Diffuse interstitial prominence throughout the
left lung remains, probably representing interstitial pneumonitis.
Right lung clear. No change in cardiac silhouette.
